# Patient Record
Sex: Male | Born: 1990
Health system: Southern US, Community
[De-identification: ages and names within clinical notes are randomized; demographics above are authoritative.]

## PROBLEM LIST (undated history)

## (undated) ENCOUNTER — Emergency Department (HOSPITAL_BASED_OUTPATIENT_CLINIC_OR_DEPARTMENT_OTHER): Admission: EM | Payer: Self-pay | Source: Home / Self Care

## (undated) DIAGNOSIS — F329 Major depressive disorder, single episode, unspecified: Secondary | ICD-10-CM

## (undated) DIAGNOSIS — F32A Depression, unspecified: Secondary | ICD-10-CM

## (undated) HISTORY — PX: OPEN ANTERIOR SHOULDER RECONSTRUCTION: SHX2100

---
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
## 2014-04-29 ENCOUNTER — Emergency Department (HOSPITAL_BASED_OUTPATIENT_CLINIC_OR_DEPARTMENT_OTHER)
Admission: EM | Admit: 2014-04-29 | Discharge: 2014-04-29 | Disposition: A | Discharge status: Home / Self Care | Payer: Self-pay | Attending: Emergency Medicine | Admitting: Emergency Medicine

## 2014-04-29 ENCOUNTER — Emergency Department (HOSPITAL_BASED_OUTPATIENT_CLINIC_OR_DEPARTMENT_OTHER): Payer: Self-pay

## 2014-04-29 ENCOUNTER — Encounter (HOSPITAL_BASED_OUTPATIENT_CLINIC_OR_DEPARTMENT_OTHER): Payer: Self-pay | Admitting: *Deleted

## 2014-04-29 DIAGNOSIS — Y9289 Other specified places as the place of occurrence of the external cause: Secondary | ICD-10-CM | POA: Insufficient documentation

## 2014-04-29 DIAGNOSIS — Y9301 Activity, walking, marching and hiking: Secondary | ICD-10-CM | POA: Insufficient documentation

## 2014-04-29 DIAGNOSIS — Y990 Civilian activity done for income or pay: Secondary | ICD-10-CM | POA: Insufficient documentation

## 2014-04-29 DIAGNOSIS — Z720 Tobacco use: Secondary | ICD-10-CM | POA: Insufficient documentation

## 2014-04-29 DIAGNOSIS — S93402A Sprain of unspecified ligament of left ankle, initial encounter: Secondary | ICD-10-CM | POA: Insufficient documentation

## 2014-04-29 DIAGNOSIS — W1842XA Slipping, tripping and stumbling without falling due to stepping into hole or opening, initial encounter: Secondary | ICD-10-CM | POA: Insufficient documentation

## 2014-04-29 NOTE — Discharge Instructions (Signed)
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
## 2014-04-29 NOTE — ED Provider Notes (Addendum)
CSN: [id number]     Arrival date & time 04/29/14  1513 History   First MD Initiated Contact with Patient 04/29/14 1515     Chief Complaint  Patient presents with  . Ankle Injury     (Consider location/radiation/quality/duration/timing/severity/associated sxs/prior Treatment) Patient is a 24 y.o. male presenting with lower extremity injury. The history is provided by the patient.  Ankle Injury This is a new (walking at work and stepped in a hole causing his left ankle to roll out) problem. The current episode started 3 to 5 hours ago. The problem occurs constantly. The problem has not changed since onset.Associated symptoms comments: Ankle pain.  No knee or leg pain. Normal sensation.. The symptoms are aggravated by walking. The symptoms are relieved by rest. He has tried rest for the symptoms. The treatment provided no relief.    History reviewed. No pertinent past medical history. History reviewed. No pertinent past surgical history. History reviewed. No pertinent family history. History  Substance Use Topics  . Smoking status: Current Every Day Smoker -- 1.00 packs/day    Types: Cigarettes  . Smokeless tobacco: Not on file  . Alcohol Use: No    Review of Systems  All other systems reviewed and are negative.     Allergies  Review of patient's allergies indicates no known allergies.  Home Medications   Prior to Admission medications   Not on File   BP 124/99 mmHg  Pulse 80  Temp(Src) 98.3 F (36.8 C) (Oral)  Resp 16  Ht 5\' 6"  (1.676 m)  Wt 145 lb (65.772 kg)  BMI 23.41 kg/m2  SpO2 100% Physical Exam  Constitutional: He is oriented to person, place, and time. He appears well-developed and well-nourished. No distress.  HENT:  Head: Normocephalic and atraumatic.  Eyes: EOM are normal. Pupils are equal, round, and reactive to light.  Cardiovascular: Normal rate.   Pulmonary/Chest: Effort normal.  Musculoskeletal:       Left ankle: He exhibits decreased range of  motion and swelling. Tenderness. Lateral malleolus tenderness found. No medial malleolus, no posterior TFL, no head of 5th metatarsal and no proximal fibula tenderness found.       Feet:  Tenderness over the anterior talofibular ligament with mild swelling and tenderness.  Pain with flexion and full extension of the foot.  Joint at this time appears stable.  Neurological: He is alert and oriented to person, place, and time.  Skin: Skin is warm and dry.  Psychiatric: He has a normal mood and affect. His behavior is normal.  Nursing note and vitals reviewed.   ED Course  Procedures (including critical care time) Labs Review Labs Reviewed - No data to display  Imaging Review Dg Ankle Complete Left  04/29/2014   CLINICAL DATA:  Pain following rolling type injury  EXAM: LEFT ANKLE COMPLETE - 3+ VIEW  COMPARISON:  None.  FINDINGS: Frontal, oblique, and lateral views obtained. There is no fracture or effusion. The ankle mortise appears intact. No appreciable joint space narrowing.  IMPRESSION: No fracture. Ankle mortise appears intact. No appreciable arthropathy.   Electronically Signed   By: Bretta BangWilliam  Woodruff III M.D.   On: 04/29/2014 15:35     EKG Interpretation None      MDM   Final diagnoses:  Ankle sprain, left, initial encounter    Patient with a twisting injury to his ankle today while at work and walking. Imaging is negative and no fibular head tenderness. Feel most likely that patient has a anterior talofibular  ligament sprain. Neurovascularly intact. Patient placed in Aircast and crutches.    Gwyneth Sprout, MD 04/29/14 1551  Gwyneth Sprout, MD 04/29/14 1553

## 2014-12-03 ENCOUNTER — Emergency Department (HOSPITAL_COMMUNITY): Payer: Medicaid Other

## 2014-12-03 ENCOUNTER — Encounter (HOSPITAL_COMMUNITY): Payer: Self-pay | Admitting: Neurology

## 2014-12-03 ENCOUNTER — Emergency Department (HOSPITAL_COMMUNITY): Payer: Self-pay

## 2014-12-03 ENCOUNTER — Emergency Department (HOSPITAL_COMMUNITY)
Admission: EM | Admit: 2014-12-03 | Discharge: 2014-12-03 | Disposition: A | Discharge status: Home / Self Care | Payer: Self-pay | Attending: Emergency Medicine | Admitting: Emergency Medicine

## 2014-12-03 DIAGNOSIS — R52 Pain, unspecified: Secondary | ICD-10-CM

## 2014-12-03 DIAGNOSIS — F17210 Nicotine dependence, cigarettes, uncomplicated: Secondary | ICD-10-CM | POA: Insufficient documentation

## 2014-12-03 DIAGNOSIS — R1030 Lower abdominal pain, unspecified: Secondary | ICD-10-CM

## 2014-12-03 DIAGNOSIS — N210 Calculus in bladder: Secondary | ICD-10-CM | POA: Insufficient documentation

## 2014-12-03 LAB — URINE MICROSCOPIC-ADD ON

## 2014-12-03 LAB — URINALYSIS, ROUTINE W REFLEX MICROSCOPIC
BILIRUBIN URINE: NEGATIVE
GLUCOSE, UA: NEGATIVE mg/dL
Ketones, ur: NEGATIVE mg/dL
Leukocytes, UA: NEGATIVE
Nitrite: NEGATIVE
PH: 8 (ref 5.0–8.0)
Protein, ur: 30 mg/dL — AB
SPECIFIC GRAVITY, URINE: 1.026 (ref 1.005–1.030)

## 2014-12-03 MED ORDER — OXYCODONE-ACETAMINOPHEN 5-325 MG PO TABS: 1.0000 | ORAL_TABLET | Freq: Four times a day (QID) | ORAL | Status: DC | PRN

## 2014-12-03 MED ORDER — OXYCODONE-ACETAMINOPHEN 5-325 MG PO TABS
1.0000 | ORAL_TABLET | Freq: Once | ORAL | Status: AC
  Administered 2014-12-03: 1 via ORAL
  Filled 2014-12-03: qty 1

## 2014-12-03 NOTE — ED Notes (Signed)
Pt stable, ambulatory, states understanding of discharge instructions 

## 2014-12-03 NOTE — Discharge Instructions (Signed)
1. Medications: Percocet for pain as needed - this medication can make you drowsy - please do not drink, drive, or operate heavy machinery while on this medication, continue usual home medications 2. Treatment: rest, drink plenty of fluids 3. Follow Up: Please follow up with the urologist listed above for discussion of your diagnoses and further evaluation after today's visit; Please return to the ER for any new or worsening symptoms, any additional concerns.

## 2014-12-03 NOTE — ED Provider Notes (Signed)
CSN: [id number]     Arrival date & time 12/03/14  1106 History   First MD Initiated Contact with Patient 12/03/14 1151     Chief Complaint  Patient presents with  . Groin Pain     (Consider location/radiation/quality/duration/timing/severity/associated sxs/prior Treatment) Patient is a 24 y.o. male presenting with groin pain. The history is provided by the patient and medical records. No language interpreter was used.  Groin Pain Pertinent negatives include no abdominal pain, arthralgias, congestion, coughing, headaches, myalgias, nausea, neck pain, rash, sore throat, vomiting or weakness.  Zachary Meyers is a 24 y.o. male  who presents to the Emergency Department complaining of acute onset of right sided testicular pain beginning this morning. Per patient, a similar episode has occurred in the past about a year ago, but he is unsure of the diagnosis and events surrounding episode. No dysuria, frequency, discharge. Admits to dribbling, hesitancy x 1 day. No other associated symptoms. 10/10 at onset. 4/10 now - pt. Has received pain medication upon arrival.    History reviewed. No pertinent past medical history. History reviewed. No pertinent past surgical history. No family history on file. Social History  Substance Use Topics  . Smoking status: Current Every Day Smoker -- 1.00 packs/day    Types: Cigarettes  . Smokeless tobacco: None  . Alcohol Use: No    Review of Systems  Constitutional: Negative.   HENT: Negative for congestion, rhinorrhea and sore throat.   Eyes: Negative for visual disturbance.  Respiratory: Negative for cough, shortness of breath and wheezing.   Cardiovascular: Negative.   Gastrointestinal: Negative for nausea, vomiting, abdominal pain, diarrhea and constipation.  Genitourinary: Positive for difficulty urinating and testicular pain. Negative for urgency, frequency and discharge.  Musculoskeletal: Negative for myalgias, back pain, arthralgias and neck pain.   Skin: Negative for rash.  Neurological: Negative for dizziness, weakness and headaches.      Allergies  Review of patient's allergies indicates no known allergies.  Home Medications   Prior to Admission medications   Medication Sig Start Date End Date Taking? Authorizing Provider  oxyCODONE-acetaminophen (PERCOCET/ROXICET) 5-325 MG tablet Take 1 tablet by mouth every 6 (six) hours as needed for severe pain. 12/03/14   Jaime Pilcher Ward, PA-C   BP 116/70 mmHg  Pulse 50  Temp(Src) 97.7 F (36.5 C) (Oral)  Resp 15  SpO2 98% Physical Exam  Constitutional: He is oriented to person, place, and time. He appears well-developed and well-nourished.  Alert and in no acute distress  HENT:  Head: Normocephalic and atraumatic.  Cardiovascular: Normal rate, regular rhythm, normal heart sounds and intact distal pulses.  Exam reveals no gallop and no friction rub.   No murmur heard. Pulmonary/Chest: Effort normal and breath sounds normal. No respiratory distress. He has no wheezes. He has no rales. He exhibits no tenderness.  Abdominal: He exhibits no mass. There is no rebound and no guarding.  Abdomen soft, non-distended Mild suprapubic tenderness Bowel sounds positive in all four quadrants  Genitourinary: Penis normal. Cremasteric reflex is present. Right testis shows tenderness. No penile erythema or penile tenderness. No discharge found.  Asymmetric, high-riding right testicle + scrotal swelling  Musculoskeletal: He exhibits no edema.  Neurological: He is alert and oriented to person, place, and time.  Skin: Skin is warm and dry. No rash noted.  Psychiatric: He has a normal mood and affect. His behavior is normal. Judgment and thought content normal.  Nursing note and vitals reviewed.   ED Course  Procedures (including critical  care time) Labs Review Labs Reviewed  URINALYSIS, ROUTINE W REFLEX MICROSCOPIC (NOT AT [facility]) - Abnormal; Notable for the following:    APPearance TURBID  (*)    Hgb urine dipstick LARGE (*)    Protein, ur 30 (*)    All other components within normal limits  URINE MICROSCOPIC-ADD ON - Abnormal; Notable for the following:    Squamous Epithelial / LPF 0-5 (*)    Bacteria, UA FEW (*)    All other components within normal limits  GC/CHLAMYDIA PROBE AMP ([place]) NOT AT [facility]    Imaging Review US Scrotum  12/03/2014  CLINICAL DATA:  Testicular pain, evaluate for testicular torsion. EXAM: SCROTAL ULTRASOUND DOPPLER ULTRASOUND OF THE TESTICLES TECHNIQUE: Complete ultrasound examination of the testicles, epididymis, and other scrotal structures was performed. Color and spectral Doppler ultrasound were also utilized to evaluate blood flow to the testicles. COMPARISON:  None in PACs FINDINGS: Right testicle Measurements: 4.1 x 2.2 x 2.8 cm. No mass or microlithiasis visualized. Left testicle Measurements: 4.3 x 1.9 x 2.7 cm. No mass or microlithiasis visualized. Right epididymis:  Normal in size and appearance. Left epididymis:  Normal in size and appearance. Hydrocele:  None visualized. Varicocele:  None visualized. Pulsed Doppler interrogation of both testes demonstrates normal low resistance arterial and venous waveforms bilaterally. IMPRESSION: 1. The testes exhibit normal echotexture and vascularity. No testicular masses are observed. 2. The epididymal structures are normal. There is no evidence of a epididymal cyst or acute epididymitis. 3. There is no hydrocele nor varicocele. Electronically Signed   By: Oakes  [place] M.D.   On: 12/03/2014 13:23   [place] Art/ven Flow Abd Pelv Doppler  12/03/2014  CLINICAL DATA:  Testicular pain, evaluate for testicular torsion. EXAM: SCROTAL ULTRASOUND DOPPLER ULTRASOUND OF THE TESTICLES TECHNIQUE: Complete ultrasound examination of the testicles, epididymis, and other scrotal structures was performed. Color and spectral Doppler ultrasound were also utilized to evaluate blood flow to the testicles. COMPARISON:  None in  PACs FINDINGS: Right testicle Measurements: 4.1 x 2.2 x 2.8 cm. No mass or microlithiasis visualized. Left testicle Measurements: 4.3 x 1.9 x 2.7 cm. No mass or microlithiasis visualized. Right epididymis:  Normal in size and appearance. Left epididymis:  Normal in size and appearance. Hydrocele:  None visualized. Varicocele:  None visualized. Pulsed Doppler interrogation of both testes demonstrates normal low resistance arterial and venous waveforms bilaterally. IMPRESSION: 1. The testes exhibit normal echotexture and vascularity. No testicular masses are observed. 2. The epididymal structures are normal. There is no evidence of a epididymal cyst or acute epididymitis. 3. There is no hydrocele nor varicocele. Electronically Signed   By: Joshu  [place] M.D.   On: 12/03/2014 13:23   Ct Renal Stone Study  12/03/2014  CLINICAL DATA:  Woke up this morning with right testicle pain, denies swelling. Reports hx of same unsure of cause. Able to urinate a small amount. Pt is uncomfortable sitting. Denies hematuria EXAM: CT ABDOMEN AND PELVIS WITHOUT CONTRAST TECHNIQUE: Multidetector CT imaging of the abdomen and pelvis was performed following the standard protocol without IV contrast. COMPARISON:  Current scrotal ultrasound. Previous abdomen and pelvis CT, 10/09/2009 FINDINGS: Lung bases:  Clear.  Heart normal size. Liver, spleen, gallbladder, pancreas, adrenal glands:  Normal. Kidneys, ureters, bladder: There is a 2 mm stone in the right posterior inferior bladder that may be within the bladder mucosal side of the right ureterovesicular junction. There is slight prominence of the right renal pelvis and mild dilation of the right ureter. There are  no other ureteral stones. There is no intrarenal stone. No renal masses. Bladder is otherwise unremarkable. Lymph nodes:  No adenopathy. Ascites:  None. Gastrointestinal:  Normal.  Normal appendix visualized. Musculoskeletal:  Normal. IMPRESSION: 1. 2 mm stone projects along the  right posterior inferior bladder. This likely resides in the bladder side of the right ureterovesicular junction. There is mild dilation of right ureter mild prominence of the right renal pelvis. No other evidence of obstruction. The stone potentially may have passed into the bladder. 2. No other abnormalities.  No intrarenal stones. Electronically Signed   By: Amie Portland M.D.   On: 12/03/2014 15:59   I have personally reviewed and evaluated these images and lab results as part of my medical decision-making.   EKG Interpretation None      MDM   Final diagnoses:  Groin pain  Bladder stone   Zachary Bast presents with acute onset of scrotal pain. Asymmetric testes, + cremasteric reflexes bilat. U/s shows normal testes, no torsion, no masses. No evidence for epididymitis or epididymal cyst, no hydrocele or varicocele.  U/a shows large amounts of blood in urine.  Discussed case with on call urology, who recommend CT to rule out distal ureteral stone, if negative f/up OP CT shows 2 mm stone of right utererovesicular junction which is likely cause of pain and swelling. Will discharge home with pain meds and follow up with urology. Discharge instructions given, pain controlled.    [facility] Ward, PA-C 12/03/14 1705  Pricilla Loveless, MD 12/04/14 [phone]

## 2014-12-03 NOTE — ED Notes (Signed)
Patient transported to Ultrasound 

## 2014-12-03 NOTE — ED Notes (Signed)
Woke up this morning with right testicle pain, denies swelling. Reports hx of same unsure of cause. Able to urinate a small amount. Pt is uncomfortable sitting. Denies hematuria.

## 2014-12-04 LAB — GC/CHLAMYDIA PROBE AMP (~~LOC~~) NOT AT ARMC
Chlamydia: NEGATIVE
Neisseria Gonorrhea: NEGATIVE

## 2015-09-13 ENCOUNTER — Ambulatory Visit (INDEPENDENT_AMBULATORY_CARE_PROVIDER_SITE_OTHER): Payer: No Typology Code available for payment source | Admitting: Family Medicine

## 2015-09-13 ENCOUNTER — Encounter: Payer: Self-pay | Admitting: Family Medicine

## 2015-09-13 VITALS — BP 117/70 | HR 65 | Temp 98.0°F | Resp 16 | Ht 66.0 in | Wt 131.0 lb

## 2015-09-13 DIAGNOSIS — L409 Psoriasis, unspecified: Secondary | ICD-10-CM

## 2015-09-13 DIAGNOSIS — K0889 Other specified disorders of teeth and supporting structures: Secondary | ICD-10-CM

## 2015-09-13 DIAGNOSIS — Z23 Encounter for immunization: Secondary | ICD-10-CM

## 2015-09-13 DIAGNOSIS — Z0000 Encounter for general adult medical examination without abnormal findings: Secondary | ICD-10-CM

## 2015-09-13 LAB — COMPLETE METABOLIC PANEL WITH GFR
ALBUMIN: 4.3 g/dL (ref 3.6–5.1)
ALK PHOS: 65 U/L (ref 40–115)
ALT: 33 U/L (ref 9–46)
AST: 27 U/L (ref 10–40)
BUN: 12 mg/dL (ref 7–25)
CHLORIDE: 106 mmol/L (ref 98–110)
CO2: 27 mmol/L (ref 20–31)
Calcium: 9.3 mg/dL (ref 8.6–10.3)
Creat: 0.85 mg/dL (ref 0.60–1.35)
GFR, Est African American: >89 mL/min (ref >=60)
GLUCOSE: 75 mg/dL (ref 65–99)
POTASSIUM: 4.4 mmol/L (ref 3.5–5.3)
SODIUM: 141 mmol/L (ref 135–146)
Total Bilirubin: 0.4 mg/dL (ref 0.2–1.2)
Total Protein: 6.2 g/dL (ref 6.1–8.1)

## 2015-09-13 LAB — CBC WITH DIFFERENTIAL/PLATELET
BASOS ABS: 0 {cells}/uL (ref 0–200)
Basophils Relative: 0 %
EOS ABS: 146 {cells}/uL (ref 15–500)
Eosinophils Relative: 2 %
HEMATOCRIT: 42.3 % (ref 38.5–50.0)
HEMOGLOBIN: 14.5 g/dL (ref 13.2–17.1)
LYMPHS ABS: 1971 {cells}/uL (ref 850–3900)
Lymphocytes Relative: 27 %
MCH: 32.2 pg (ref 27.0–33.0)
MCHC: 34.3 g/dL (ref 32.0–36.0)
MCV: 94 fL (ref 80.0–100.0)
MONO ABS: 657 {cells}/uL (ref 200–950)
MPV: 10.2 fL (ref 7.5–12.5)
Monocytes Relative: 9 %
NEUTROS ABS: 4526 {cells}/uL (ref 1500–7800)
NEUTROS PCT: 62 %
Platelets: 235 10*3/uL (ref 140–400)
RBC: 4.5 MIL/uL (ref 4.20–5.80)
RDW: 12.8 % (ref 11.0–15.0)
WBC: 7.3 10*3/uL (ref 3.8–10.8)

## 2015-09-13 LAB — HIV ANTIBODY (ROUTINE TESTING W REFLEX): HIV 1&2 Ab, 4th Generation: NONREACTIVE

## 2015-09-14 LAB — HEMOGLOBIN A1C
HEMOGLOBIN A1C: 4.7 % (ref <5.7)
Mean Plasma Glucose: 88 mg/dL

## 2015-09-24 NOTE — Progress Notes (Signed)
Zachary Meyers Iddings, is a 25 y.o. male  YNW:[id number]:[id number]  MVH:[id number]:[id number]  DOB - March 13, 1990  CC:  Chief Complaint  Patient presents with  . Establish Care    psoriasis  . Dental Pain    broken tooth        HPI: Zachary Meyers Hopson is a 25 y.o. male here to establish care. He reports being generally healthy except for psorasis. He also request dental referral for broken tooth. He does report being treated with sertraline for depression. He is on no other chronic medictions. He reports chronic back pain and minor headaches.   Health Maintenance: His Tdap is up to date and he will receive influenza vaccine today. He reports smoking about 10 cigarettes a day and not being ready to quit. He drinks occ alcohol.   No Known Allergies History reviewed. No pertinent past medical history. Current Outpatient Prescriptions on File Prior to Visit  Medication Sig Dispense Refill  . oxyCODONE-acetaminophen (PERCOCET/ROXICET) 5-325 MG tablet Take 1 tablet by mouth every 6 (six) hours as needed for severe pain. (Patient not taking: Reported on 09/13/2015) 8 tablet 0   No current facility-administered medications on file prior to visit.    History reviewed. No pertinent family history. Social History   Social History  . Marital status: Single    Spouse name: N/A  . Number of children: N/A  . Years of education: N/A   Occupational History  . Not on file.   Social History Main Topics  . Smoking status: Current Every Day Smoker    Packs/day: 0.50    Types: Cigarettes  . Smokeless tobacco: Former NeurosurgeonUser  . Alcohol use Yes     Comment: occ  . Drug use: No  . Sexual activity: Yes    Birth control/ protection: None   Other Topics Concern  . Not on file   Social History Narrative  . No narrative on file    Review of Systems: Constitutional: Negative Skin: Positive for psorasis HENT: Negative  Eyes: Negative  Neck: Negative Respiratory: Negative Cardiovascular: Negative Gastrointestinal:  Negative Genitourinary: Negative  Musculoskeletal: Positive for low back pain   Neurological: positive for occ headaches Hematological: Negative  Psychiatric/Behavioral: positive for depression   Objective:   Vitals:   09/13/15 1411  BP: 117/70  Pulse: 65  Resp: 16  Temp: 98 F (36.7 C)    Physical Exam: Constitutional: Patient appears well-developed and well-nourished. No distress. HENT: Normocephalic, atraumatic, External right and left ear normal. Oropharynx is clear and moist. Teeth in poor repair Eyes: Conjunctivae and EOM are normal. PERRLA, no scleral icterus. Neck: Normal ROM. Neck supple. No lymphadenopathy, No thyromegaly. CVS: RRR, S1/S2 +, no murmurs, no gallops, no rubs Pulmonary: Effort and breath sounds normal, no stridor, rhonchi, wheezes, rales.  Abdominal: Soft. Normoactive BS,, no distension, tenderness, rebound or guarding.  Musculoskeletal: Normal range of motion. No edema and no tenderness.  Neuro: Alert.Normal muscle tone coordination. Non-focal Skin: Positive for wide-spread plaque psorasis on anterior and posterior trunk. Psychiatric: Normal mood and affect. Behavior, judgment, thought content normal.  Lab Results  Component Value Date   WBC 7.3 09/13/2015   HGB 14.5 09/13/2015   HCT 42.3 09/13/2015   MCV 94.0 09/13/2015   PLT 235 09/13/2015   Lab Results  Component Value Date   CREATININE 0.85 09/13/2015   BUN 12 09/13/2015   NA 141 09/13/2015   K 4.4 09/13/2015   CL 106 09/13/2015   CO2 27 09/13/2015    Lab Results  Component Value Date   HGBA1C 4.7 09/13/2015   Lipid Panel  No results found for: CHOL, TRIG, HDL, CHOLHDL, VLDL, LDLCALC     Assessment and plan:   1. Psoriasis -Ambulatory referral to dermatology  2. Pain, dental  - Ambulatory referral to Dentistry  3. Healthcare maintenance * - COMPLETE METABOLIC PANEL WITH GFR - CBC with Differential - Hemoglobin A1c - HIV antibody (with reflex) - Flu Vaccine QUAD 36+  mos PF IM (Fluarix & Fluzone Quad PF)   Return in about 3 months (around 12/13/2015).  The patient was given clear instructions to go to ER or return to medical center if symptoms don't improve, worsen or new problems develop. The patient verbalized understanding.    Henrietta Hoover FNP  09/24/2015, 2:51 PM

## 2015-11-16 ENCOUNTER — Encounter (HOSPITAL_BASED_OUTPATIENT_CLINIC_OR_DEPARTMENT_OTHER): Payer: Self-pay

## 2015-11-16 ENCOUNTER — Emergency Department (HOSPITAL_BASED_OUTPATIENT_CLINIC_OR_DEPARTMENT_OTHER)
Admission: EM | Admit: 2015-11-16 | Discharge: 2015-11-16 | Disposition: A | Discharge status: Home / Self Care | Payer: Self-pay | Attending: Emergency Medicine | Admitting: Emergency Medicine

## 2015-11-16 ENCOUNTER — Emergency Department (HOSPITAL_BASED_OUTPATIENT_CLINIC_OR_DEPARTMENT_OTHER): Payer: Self-pay

## 2015-11-16 DIAGNOSIS — R079 Chest pain, unspecified: Secondary | ICD-10-CM

## 2015-11-16 DIAGNOSIS — F17210 Nicotine dependence, cigarettes, uncomplicated: Secondary | ICD-10-CM | POA: Insufficient documentation

## 2015-11-16 DIAGNOSIS — R0789 Other chest pain: Secondary | ICD-10-CM | POA: Insufficient documentation

## 2015-11-16 HISTORY — DX: Major depressive disorder, single episode, unspecified: F32.9

## 2015-11-16 HISTORY — DX: Depression, unspecified: F32.A

## 2015-11-16 LAB — CBC WITH DIFFERENTIAL/PLATELET
Basophils Absolute: 0 10*3/uL (ref 0.0–0.1)
Basophils Relative: 0 %
EOS PCT: 1 %
Eosinophils Absolute: 0.1 10*3/uL (ref 0.0–0.7)
HEMATOCRIT: 42.8 % (ref 39.0–52.0)
Hemoglobin: 14.4 g/dL (ref 13.0–17.0)
LYMPHS PCT: 22 %
Lymphs Abs: 2.1 10*3/uL (ref 0.7–4.0)
MCH: 32.1 pg (ref 26.0–34.0)
MCHC: 33.6 g/dL (ref 30.0–36.0)
MCV: 95.3 fL (ref 78.0–100.0)
MONO ABS: 0.7 10*3/uL (ref 0.1–1.0)
MONOS PCT: 7 %
NEUTROS ABS: 6.6 10*3/uL (ref 1.7–7.7)
Neutrophils Relative %: 70 %
PLATELETS: 217 10*3/uL (ref 150–400)
RBC: 4.49 MIL/uL (ref 4.22–5.81)
RDW: 12.3 % (ref 11.5–15.5)
WBC: 9.5 10*3/uL (ref 4.0–10.5)

## 2015-11-16 LAB — BASIC METABOLIC PANEL
ANION GAP: 6 (ref 5–15)
BUN: 15 mg/dL (ref 6–20)
CO2: 26 mmol/L (ref 22–32)
Calcium: 9.1 mg/dL (ref 8.9–10.3)
Chloride: 105 mmol/L (ref 101–111)
Creatinine, Ser: 0.84 mg/dL (ref 0.61–1.24)
GFR calc Af Amer: >60 mL/min (ref >60)
GLUCOSE: 97 mg/dL (ref 65–99)
POTASSIUM: 4 mmol/L (ref 3.5–5.1)
Sodium: 137 mmol/L (ref 135–145)

## 2015-11-16 LAB — TROPONIN I: Troponin I: <0.03 ng/mL (ref <0.03)

## 2015-11-16 MED ORDER — KETOROLAC TROMETHAMINE 30 MG/ML IJ SOLN
30.0000 mg | Freq: Once | INTRAMUSCULAR | Status: AC
  Administered 2015-11-16: 30 mg via INTRAVENOUS
  Filled 2015-11-16: qty 1

## 2015-11-16 MED ORDER — ALBUTEROL SULFATE HFA 108 (90 BASE) MCG/ACT IN AERS
2.0000 | INHALATION_SPRAY | Freq: Four times a day (QID) | RESPIRATORY_TRACT | Status: DC | PRN
  Administered 2015-11-16: 2 via RESPIRATORY_TRACT
  Filled 2015-11-16: qty 6.7

## 2015-11-16 NOTE — ED Triage Notes (Signed)
CP x today-started while at work putting dishes away-NAD-steady gait

## 2015-11-16 NOTE — Discharge Instructions (Signed)
Ibuprofen 600 mg every 6 hours as needed for pain. ° °Return to the emergency department if your symptoms significantly worsen or change. °

## 2015-11-16 NOTE — ED Provider Notes (Signed)
[facility] Provider Note   CSN: [id number] Arrival date & time: 11/16/15  1256     History   Chief Complaint Chief Complaint  Patient presents with  . Chest Pain    HPI Zachary Meyers is a 25 y.o. male.  Patient is a 25 year old male with history of depression and psoriasis. He presents for evaluation of chest pain. This began while he was at work. He was washing dishes and developed the sudden onset of severe, sharp pain to the center of his chest that "took his breath away". This lasted for several minutes, then significantly improved. He only feels a minor discomfort now. He denies any cardiac history. He denies any recent cough, fevers, or chills.      Past Medical History:  Diagnosis Date  . Depression     Patient Active Problem List   Diagnosis Date Noted  . Psoriasis 09/13/2015  . Pain, dental 09/13/2015    Past Surgical History:  Procedure Laterality Date  . OPEN ANTERIOR SHOULDER RECONSTRUCTION     right        Home Medications    Prior to Admission medications   Medication Sig Start Date End Date Taking? Authorizing Provider  sertraline (ZOLOFT) 25 MG tablet Take 25 mg by mouth daily.    Historical Provider, MD    Family History No family history on file.  Social History Social History  Substance Use Topics  . Smoking status: Current Every Day Smoker    Packs/day: 0.50    Types: Cigarettes  . Smokeless tobacco: Former NeurosurgeonUser  . Alcohol use Yes     Comment: occ     Allergies   Patient has no known allergies.   Review of Systems Review of Systems  All other systems reviewed and are negative.    Physical Exam Updated Vital Signs BP 138/90 (BP Location: Left Arm)   Pulse 78   Temp 98.1 F (36.7 C) (Oral)   Resp 16   Ht 5\' 6"  (1.676 m)   Wt 130 lb (59 kg)   SpO2 100%   BMI 20.98 kg/m   Physical Exam  Constitutional: He is oriented to person, place, and time. He appears well-developed and well-nourished. No  distress.  HENT:  Head: Normocephalic and atraumatic.  Mouth/Throat: Oropharynx is clear and moist.  Neck: Normal range of motion. Neck supple.  Cardiovascular: Normal rate and regular rhythm.  Exam reveals no friction rub.   No murmur heard. Pulmonary/Chest: Effort normal and breath sounds normal. No respiratory distress. He has no wheezes. He has no rales. He exhibits tenderness.  There is tenderness to palpation in the anterior chest wall. This reproduces his symptoms.  Abdominal: Soft. Bowel sounds are normal. He exhibits no distension. There is no tenderness.  Musculoskeletal: Normal range of motion. He exhibits no edema.  Neurological: He is alert and oriented to person, place, and time. Coordination normal.  Skin: Skin is warm and dry. He is not diaphoretic.  Nursing note and vitals reviewed.    ED Treatments / Results  Labs (all labs ordered are listed, but only abnormal results are displayed) Labs Reviewed  BASIC METABOLIC PANEL  CBC WITH DIFFERENTIAL/PLATELET  TROPONIN I    EKG  EKG Interpretation  Date/Time:  Tuesday November 16 2015 13:01:01 EST Ventricular Rate:  80 PR Interval:  122 QRS Duration: 84 QT Interval:  342 QTC Calculation: 394 R Axis:   77 Text Interpretation:  Normal sinus rhythm with sinus arrhythmia Possible Anterior infarct ,  age undetermined Abnormal ECG Confirmed by [facility]  MD, Bradlee Heitman ([id number]) on 11/16/2015 1:17:28 PM       Radiology No results found.  Procedures Procedures (including critical care time)  Medications Ordered in ED Medications  ketorolac (TORADOL) 30 MG/ML injection 30 mg (not administered)     Initial Impression / Assessment and Plan / ED Course  I have reviewed the triage vital signs and the nursing notes.  Pertinent labs & imaging results that were available during my care of the patient were reviewed by me and considered in my medical decision making (see chart for details).  Clinical Course     Patient  presents with sudden onset of severe, sharp chest pain that occurred while changing position. I suspect a musculoskeletal etiology. His workup reveals no abnormality in his EKG, troponin, and chest x-ray. He will be discharged with a diagnosis of nonspecific chest pain. I highly suspect a musculoskeletal etiology. He is to return as needed for any problems.  Final Clinical Impressions(s) / ED Diagnoses   Final diagnoses:  None    New Prescriptions New Prescriptions   No medications on file     Geoffery Lyonsouglas Fate Caster, MD 11/16/15 1423

## 2015-11-16 NOTE — ED Notes (Signed)
ED Provider at bedside. 

## 2016-02-04 ENCOUNTER — Emergency Department (HOSPITAL_COMMUNITY)
Admission: EM | Admit: 2016-02-04 | Discharge: 2016-02-04 | Disposition: A | Discharge status: Home / Self Care | Payer: No Typology Code available for payment source | Attending: Emergency Medicine | Admitting: Emergency Medicine

## 2016-02-04 ENCOUNTER — Encounter (HOSPITAL_COMMUNITY): Payer: Self-pay | Admitting: *Deleted

## 2016-02-04 DIAGNOSIS — J029 Acute pharyngitis, unspecified: Secondary | ICD-10-CM | POA: Insufficient documentation

## 2016-02-04 DIAGNOSIS — F17210 Nicotine dependence, cigarettes, uncomplicated: Secondary | ICD-10-CM | POA: Insufficient documentation

## 2016-02-04 LAB — CBC WITH DIFFERENTIAL/PLATELET
BASOS PCT: 0 %
Basophils Absolute: 0 10*3/uL (ref 0.0–0.1)
EOS ABS: 0.1 10*3/uL (ref 0.0–0.7)
Eosinophils Relative: 2 %
HEMATOCRIT: 44.8 % (ref 39.0–52.0)
Hemoglobin: 15 g/dL (ref 13.0–17.0)
Lymphocytes Relative: 27 %
Lymphs Abs: 1.7 10*3/uL (ref 0.7–4.0)
MCH: 32 pg (ref 26.0–34.0)
MCHC: 33.5 g/dL (ref 30.0–36.0)
MCV: 95.5 fL (ref 78.0–100.0)
MONO ABS: 0.5 10*3/uL (ref 0.1–1.0)
MONOS PCT: 8 %
NEUTROS ABS: 3.9 10*3/uL (ref 1.7–7.7)
Neutrophils Relative %: 63 %
Platelets: 226 10*3/uL (ref 150–400)
RBC: 4.69 MIL/uL (ref 4.22–5.81)
RDW: 12.8 % (ref 11.5–15.5)
WBC: 6.2 10*3/uL (ref 4.0–10.5)

## 2016-02-04 LAB — COMPREHENSIVE METABOLIC PANEL
ALBUMIN: 4 g/dL (ref 3.5–5.0)
ALT: 26 U/L (ref 17–63)
ANION GAP: 8 (ref 5–15)
AST: 28 U/L (ref 15–41)
Alkaline Phosphatase: 56 U/L (ref 38–126)
BILIRUBIN TOTAL: 0.8 mg/dL (ref 0.3–1.2)
BUN: 14 mg/dL (ref 6–20)
CO2: 24 mmol/L (ref 22–32)
Calcium: 9.2 mg/dL (ref 8.9–10.3)
Chloride: 108 mmol/L (ref 101–111)
Creatinine, Ser: 1.19 mg/dL (ref 0.61–1.24)
GFR calc Af Amer: >60 mL/min (ref >60)
GLUCOSE: 121 mg/dL — AB (ref 65–99)
POTASSIUM: 4.2 mmol/L (ref 3.5–5.1)
Sodium: 140 mmol/L (ref 135–145)
TOTAL PROTEIN: 6.4 g/dL — AB (ref 6.5–8.1)

## 2016-02-04 LAB — RAPID STREP SCREEN (MED CTR MEBANE ONLY): Streptococcus, Group A Screen (Direct): NEGATIVE

## 2016-02-04 MED ORDER — PENICILLIN G BENZATHINE 1200000 UNIT/2ML IM SUSP
1.2000 10*6.[IU] | Freq: Once | INTRAMUSCULAR | Status: AC
  Administered 2016-02-04: 1.2 10*6.[IU] via INTRAMUSCULAR
  Filled 2016-02-04: qty 2

## 2016-02-04 NOTE — ED Provider Notes (Signed)
[facility]-EMERGENCY DEPT Provider Note   CSN: [id number] Arrival date & time: 02/04/16  1146  By signing my name below, I, Majel Homer, attest that this documentation has been prepared under the direction and in the presence of [facility], PA-C . Electronically Signed: Majel Homer, Scribe. 02/04/2016. 2:45 PM.  History   Chief Complaint Chief Complaint  Patient presents with  . Sore Throat   The history is provided by the patient. No language interpreter was used.   HPI Comments: Zachary Meyers is a 26 y.o. male who presents to the Emergency Department complaining of gradually worsening, sore throat that began this morning. Pt reports associated fatigue, generalized body aches, nasal congestion, cough, and rhinorrhea. He notes his sister whom he frequently spends time with was recently diagnosed with strep throat ~3 days ago. Pt denies shortness of breath, fever, abdominal pain, nausea, vomiting, and diarrhea.   Past Medical History:  Diagnosis Date  . Depression    Patient Active Problem List   Diagnosis Date Noted  . Psoriasis 09/13/2015  . Pain, dental 09/13/2015   Past Surgical History:  Procedure Laterality Date  . OPEN ANTERIOR SHOULDER RECONSTRUCTION     right     Home Medications    Prior to Admission medications   Medication Sig Start Date End Date Taking? Authorizing Provider  sertraline (ZOLOFT) 25 MG tablet Take 25 mg by mouth daily.    Historical Provider, MD   Family History No family history on file.  Social History Social History  Substance Use Topics  . Smoking status: Current Every Day Smoker    Packs/day: 0.50    Types: Cigarettes  . Smokeless tobacco: Former Neurosurgeon  . Alcohol use Yes     Comment: occ   Allergies   Patient has no known allergies.  Review of Systems Review of Systems  Constitutional: Positive for fatigue. Negative for fever.  HENT: Positive for congestion, ear pain, rhinorrhea and sore throat.   Respiratory: Positive for cough.  Negative for shortness of breath.   Gastrointestinal: Negative for abdominal pain, diarrhea, nausea and vomiting.  Musculoskeletal: Positive for myalgias.  All other systems reviewed and are negative.  Physical Exam Updated Vital Signs BP 120/76 (BP Location: Right Arm)   Pulse 116   Temp 98.1 F (36.7 C) (Oral)   Resp 18   Ht 5\' 6"  (1.676 m)   Wt 134 lb (60.8 kg)   SpO2 98%   BMI 21.63 kg/m   Physical Exam  Constitutional: He is oriented to person, place, and time. He appears well-developed and well-nourished. No distress.  HENT:  Head: Normocephalic and atraumatic.  Mouth/Throat: Posterior oropharyngeal erythema present.  OP with erythema and tonsillar hypertrophy. No exudate. + nasal congestion with mucosal edema.   Neck: Normal range of motion. Neck supple.  No meningeal signs.   Cardiovascular: Normal rate, regular rhythm and normal heart sounds.   Pulmonary/Chest: Effort normal.  Lungs are clear to auscultation bilaterally - no w/r/r  Abdominal: Soft. He exhibits no distension. There is no tenderness.  Musculoskeletal: Normal range of motion.  Lymphadenopathy:    He has cervical adenopathy.  Neurological: He is alert and oriented to person, place, and time.  Skin: Skin is warm and dry. He is not diaphoretic.  Nursing note and vitals reviewed.  ED Treatments / Results  DIAGNOSTIC STUDIES:  Oxygen Saturation is 98% on RA, normal by my interpretation.    COORDINATION OF CARE:  2:42 PM Discussed treatment plan with pt at bedside  and pt agreed to plan.  Labs (all labs ordered are listed, but only abnormal results are displayed) Labs Reviewed  COMPREHENSIVE METABOLIC PANEL - Abnormal; Notable for the following:       Result Value   Glucose, Bld 121 (*)    Total Protein 6.4 (*)    All other components within normal limits  RAPID STRE[facility] SCREEN (NOT [facility])  CULTURE, GROUP A STREP ([facility])  CBC WITH DIFFERENTIAL/PLATELET    EKG  EKG Interpretation None        Radiology No results found.  Procedures Procedures (including critical care time)  Medications Ordered in ED Medications - No data to display  Initial Impression / Assessment and Plan / ED Course  I have reviewed the triage vital signs and the nursing notes.  Pertinent labs & imaging results that were available during my care of the patient were reviewed by me and considered in my medical decision making (see chart for details).     Zachary Meyers is a 26 y.o. male who presents to ED for sore throat which began this morning. His sister was recently diagnosed with strep and he is concerned that he may have gotten it as well. Lab work was obtained in triage prior to my examination. Reviewed and reassuring. On exam, patient is afebrile. Oropharynx with erythema and tonsillar hypertrophy, however no exudates appreciated. Rapid strep negative. Discussed risks of antibiotic therapy. Discussed option of prophylactic treatment given strep exposure versus awaiting culture results and antibiotics only if culture positive. Patient would prefer treatment. Bicillin IM given in ED. Home care instructions discussed and all questions answered.    I personally performed the services described in this documentation, which was scribed in my presence. The recorded information has been reviewed and is accurate.   Final Clinical Impressions(s) / ED Diagnoses   Final diagnoses:  None    New Prescriptions New Prescriptions   No medications on file         [facility] Pilcher Ward, PA-C 02/04/16 1532    Mancel BaleElliott Wentz, MD 02/07/16 1056

## 2016-02-04 NOTE — Discharge Instructions (Signed)
Salt water gargles or warm tea with honey help with sore throat. Ibuprofen can be used for additional pain as well.  Wash hands often. Rest.  Return to ER for high fevers, difficulty breathing, new or worsening symptoms, any additional concerns.

## 2016-02-04 NOTE — ED Triage Notes (Signed)
Pt c/o sore throat, reports his sister has strep throat, pt c/o fatigue & generalized body aches, pt c/o R ear pain, denies cough, A&O x4

## 2016-02-05 LAB — CULTURE, GROUP A STREP (THRC)

## 2016-03-13 ENCOUNTER — Ambulatory Visit: Payer: No Typology Code available for payment source | Admitting: Family Medicine

## 2016-08-27 ENCOUNTER — Emergency Department (HOSPITAL_COMMUNITY)
Admission: EM | Admit: 2016-08-27 | Discharge: 2016-08-27 | Disposition: A | Discharge status: Home / Self Care | Payer: No Typology Code available for payment source | Attending: Emergency Medicine | Admitting: Emergency Medicine

## 2016-08-27 ENCOUNTER — Encounter (HOSPITAL_COMMUNITY): Payer: Self-pay

## 2016-08-27 DIAGNOSIS — J36 Peritonsillar abscess: Secondary | ICD-10-CM | POA: Insufficient documentation

## 2016-08-27 DIAGNOSIS — F17210 Nicotine dependence, cigarettes, uncomplicated: Secondary | ICD-10-CM | POA: Insufficient documentation

## 2016-08-27 DIAGNOSIS — Z79899 Other long term (current) drug therapy: Secondary | ICD-10-CM | POA: Insufficient documentation

## 2016-08-27 LAB — RAPID STREP SCREEN (MED CTR MEBANE ONLY): STREPTOCOCCUS, GROUP A SCREEN (DIRECT): NEGATIVE

## 2016-08-27 MED ORDER — CLINDAMYCIN HCL 300 MG PO CAPS: 300.0000 mg | ORAL_CAPSULE | Freq: Four times a day (QID) | ORAL | 0 refills | Status: DC

## 2016-08-27 MED ORDER — IBUPROFEN 600 MG PO TABS: 600.0000 mg | ORAL_TABLET | Freq: Four times a day (QID) | ORAL | 0 refills | Status: DC | PRN

## 2016-08-27 NOTE — ED Provider Notes (Signed)
[facility] Provider Note   CSN: [id number] Arrival date & time: 08/27/16  1252     History   Chief Complaint Chief Complaint  Patient presents with  . Sore Throat    HPI Zachary Meyers is a 26 y.o. male.  HPI Patient reports he's had a swollen sore spot on the right side of his neck for about a week. He reports he thought it might go away but it hasn't. She states it feels like when he swallows, things get tight and narrow. He reports it's painful on the side of his throat. He has not developed fever or difficulty breathing. No associated symptoms. Patient ports he did have strep throat about a year ago. He reports this feels different from his episode of strep. He otherwise has not been sick. Past Medical History:  Diagnosis Date  . Depression     Patient Active Problem List   Diagnosis Date Noted  . Psoriasis 09/13/2015  . Pain, dental 09/13/2015    Past Surgical History:  Procedure Laterality Date  . OPEN ANTERIOR SHOULDER RECONSTRUCTION     right        Home Medications    Prior to Admission medications   Medication Sig Start Date End Date Taking? Authorizing Provider  clindamycin (CLEOCIN) 300 MG capsule Take 1 capsule (300 mg total) by mouth 4 (four) times daily. X 7 days 08/27/16   Arby Barrette, MD  ibuprofen (ADVIL,MOTRIN) 600 MG tablet Take 1 tablet (600 mg total) by mouth every 6 (six) hours as needed. 08/27/16   Arby Barrette, MD  sertraline (ZOLOFT) 25 MG tablet Take 25 mg by mouth daily.    [provider]    Family History History reviewed. No pertinent family history.  Social History Social History  Substance Use Topics  . Smoking status: Current Every Day Smoker    Packs/day: 0.25    Types: Cigarettes  . Smokeless tobacco: Former Neurosurgeon  . Alcohol use Yes     Comment: occ     Allergies   Patient has no known allergies.   Review of Systems Review of Systems  10 Systems reviewed and are negative for acute change  except as noted in the HPI.  Physical Exam Updated Vital Signs BP 118/77 (BP Location: Right Arm)   Pulse 97   Temp 98.2 F (36.8 C) (Oral)   Resp 16   Ht 5\' 6"  (1.676 m)   Wt 56.2 kg (124 lb)   SpO2 97%   BMI 20.01 kg/m   Physical Exam  Constitutional: He is oriented to person, place, and time. He appears well-developed and well-nourished. No distress.  HENT:  Head: Normocephalic and atraumatic.  Nose normal. No facial swelling or asymmetry. Intraoral exam shows moderate swelling of the right tonsil relative to the left. Slight trace exudate on the right tonsil. Patient does not have significant fullness anterior to the tonsillar pillar. The posterior oropharynx remains widely patent. The patient does have significant dental decay at the lower molars on the right side. He does not have facial swelling from external visualization. He does have a tender and mildly enlarged tonsillar lymph node on the right. This is mobile. No trismus.  Eyes: Pupils are equal, round, and reactive to light. EOM are normal.  Cardiovascular: Normal rate, regular rhythm and normal heart sounds.   Pulmonary/Chest: Effort normal and breath sounds normal.  Musculoskeletal: Normal range of motion.  Neurological: He is alert and oriented to person, place, and time. No cranial  nerve deficit. He exhibits normal muscle tone. Coordination normal.  Skin: Skin is warm and dry.  Patient has plaques of eczema including on his throat and face. None Show signs of secondary infection.  Psychiatric: He has a normal mood and affect.             ED Treatments / Results  Labs (all labs ordered are listed, but only abnormal results are displayed) Labs Reviewed  RAPID STREP SCREEN (NOT AT [facility])  CULTURE, GROUP A STREP [facility])    EKG  EKG Interpretation None       Radiology No results found.  Procedures Procedures (including critical care time)  Medications Ordered in ED Medications - No data to  display   Initial Impression / Assessment and Plan / ED Course  I have reviewed the triage vital signs and the nursing notes.  Pertinent labs & imaging results that were available during my care of the patient were reviewed by me and considered in my medical decision making (see chart for details).     Final Clinical Impressions(s) / ED Diagnoses   Final diagnoses:  Peritonsillar abscess  Based on physical examination I suspect early peritonsillar abscess. There is nothing drainable at this time. Although it is somewhat difficult to appreciate in the pictures in the physical exam, the patient does have moderately large swelling of the right tonsil relative to the left. Patient also has significant dental decay of the inferior molars. At this time, I will opt to initiate clindamycin. Patient is aware of close observation for any rapidly increasing swelling, fever or neck stiffness. Patient is counseled to follow-up with ENT.  New Prescriptions New Prescriptions   CLINDAMYCIN (CLEOCIN) 300 MG CAPSULE    Take 1 capsule (300 mg total) by mouth 4 (four) times daily. X 7 days   IBUPROFEN (ADVIL,MOTRIN) 600 MG TABLET    Take 1 tablet (600 mg total) by mouth every 6 (six) hours as needed.     Arby Barrette, MD 08/27/16 1409

## 2016-08-27 NOTE — ED Triage Notes (Signed)
Onset 1 1/2 weeks swollen on right side of neck, unable to chew on right side and paniful when swallowing.  No fevers, cough/cold symptoms.

## 2016-08-29 LAB — CULTURE, GROUP A STREP (THRC)

## 2016-09-01 MED FILL — CLINDAMYCIN HCL 300 MG CAP: 300 | 7 days supply | Qty: 28 | Fill #0

## 2017-04-20 IMAGING — US US ART/VEN ABD/PELV/SCROTUM DOPPLER LTD
1 series · 14 of 25 positions shown · non-contrast
Comparison: None in PACs

CLINICAL DATA: Testicular pain, evaluate for testicular torsion.

EXAM:
SCROTAL ULTRASOUND
DOPPLER ULTRASOUND OF THE TESTICLES
TECHNIQUE: Complete ultrasound examination of the testicles, epididymis, and
other scrotal structures was performed. Color and spectral Doppler
ultrasound were also utilized to evaluate blood flow to the
testicles.

[Series 1: us art/ven abd/pelv/scrotum doppler ltd · 0.07mm/px · 14 of 58 slices shown]
[im 1/58]
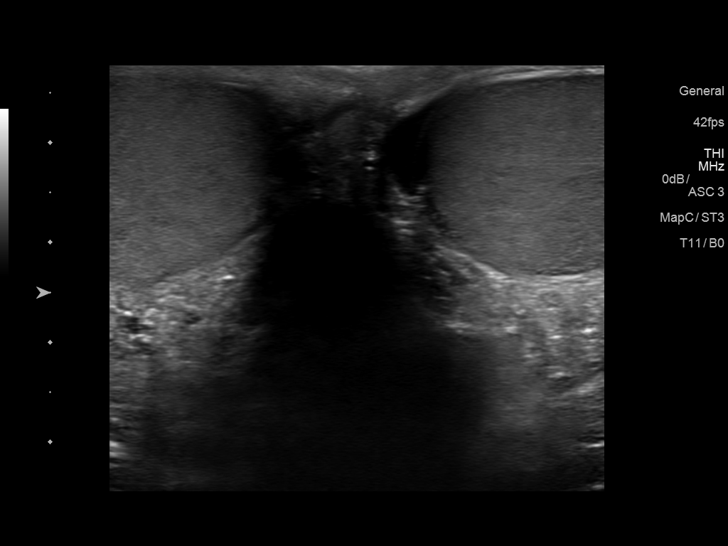
[im 5/58]
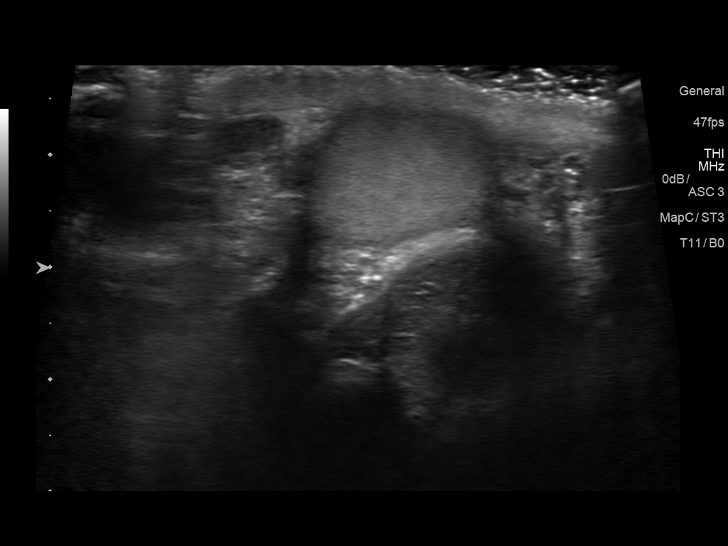
[im 10/58]
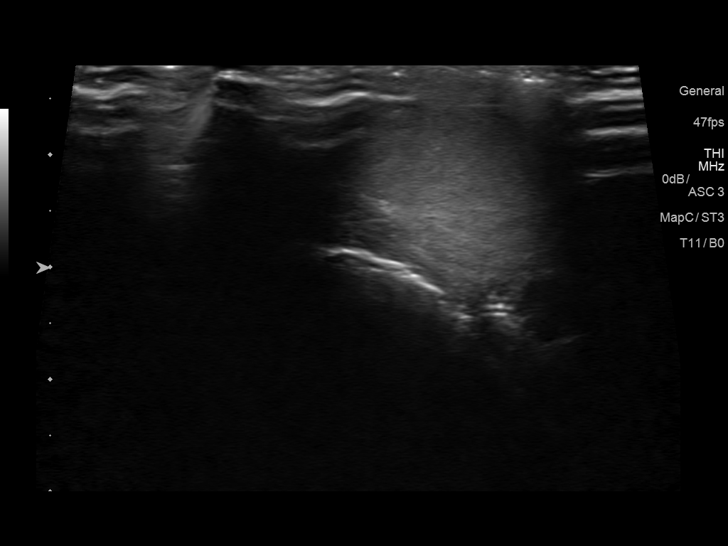
[im 15/58]
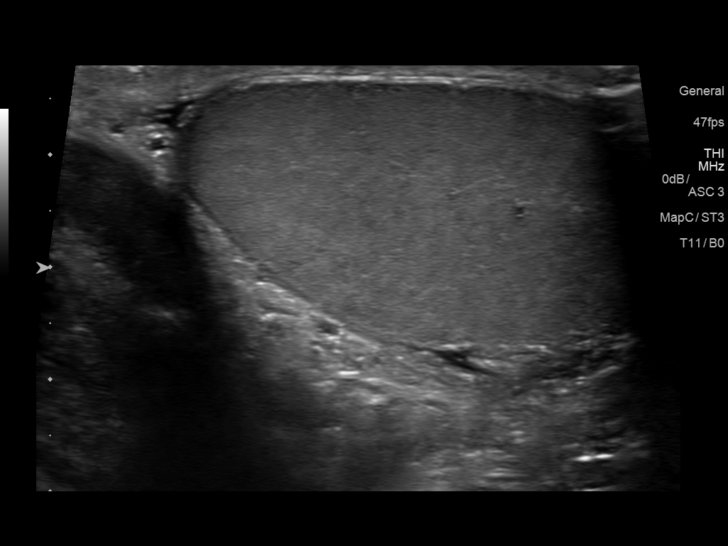
[im 20/58]
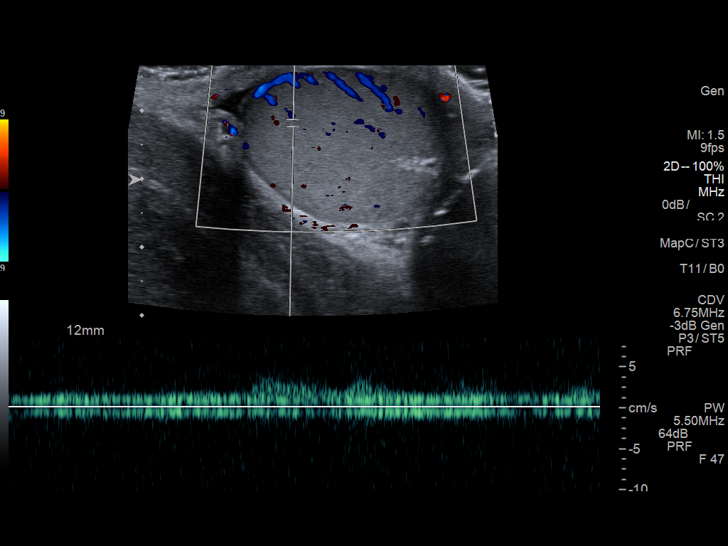
[im 22/58]
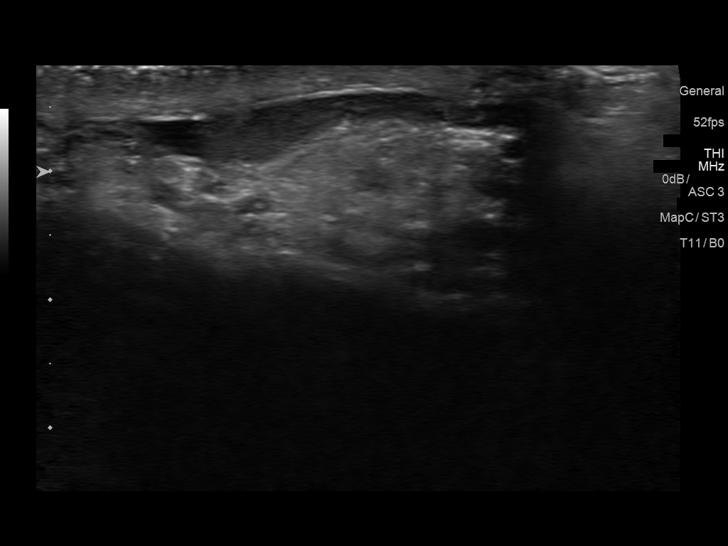
[im 27/58]
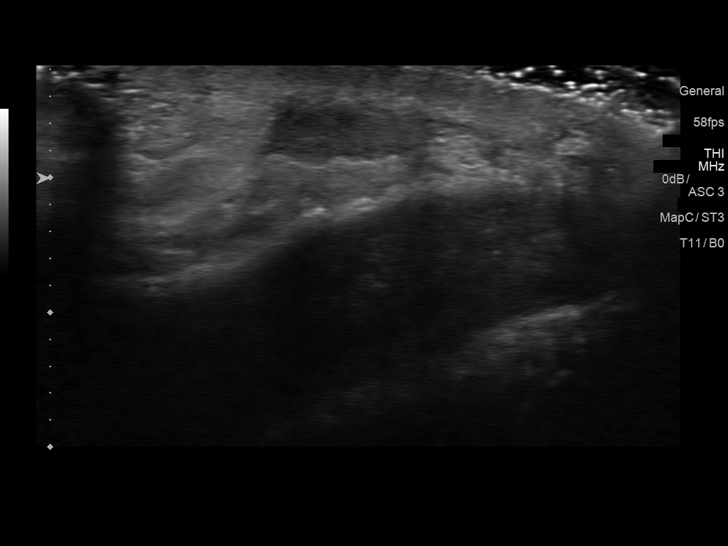
[im 31/58]
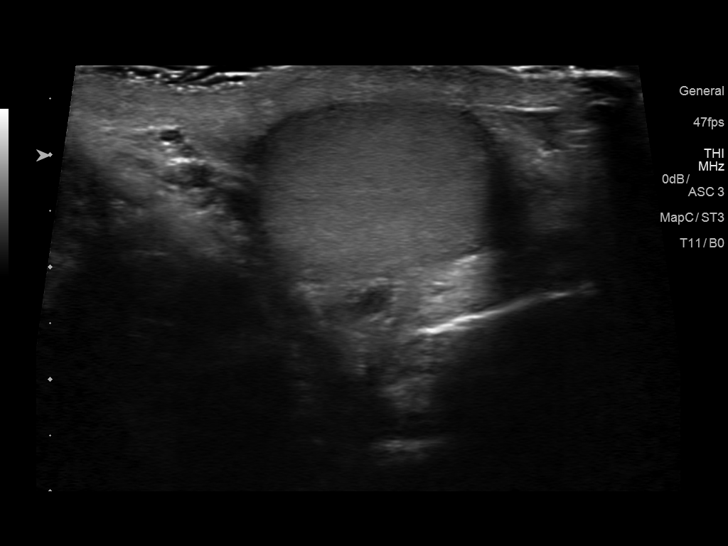
[im 36/58]
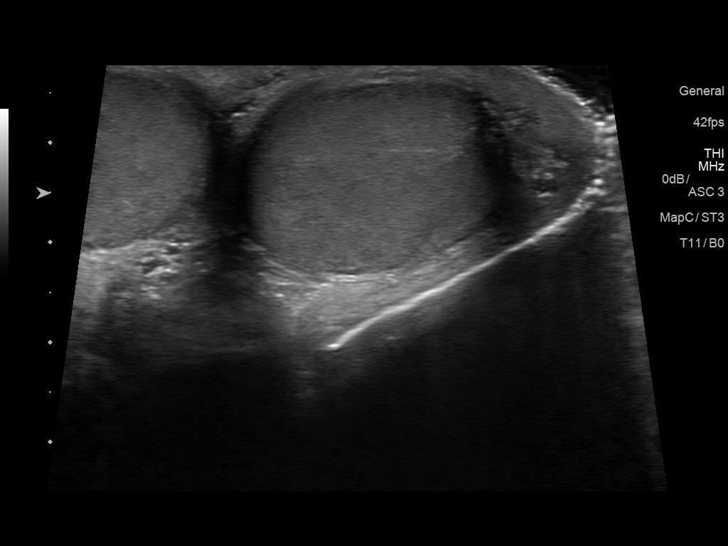
[im 39/58]
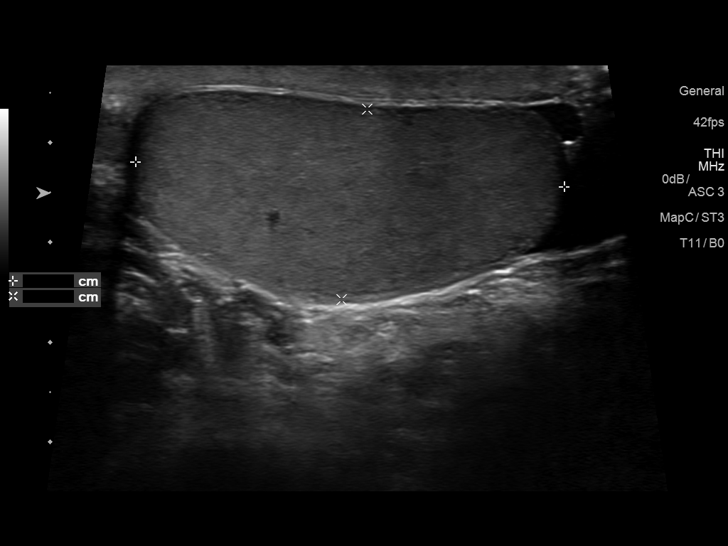
[im 43/58]
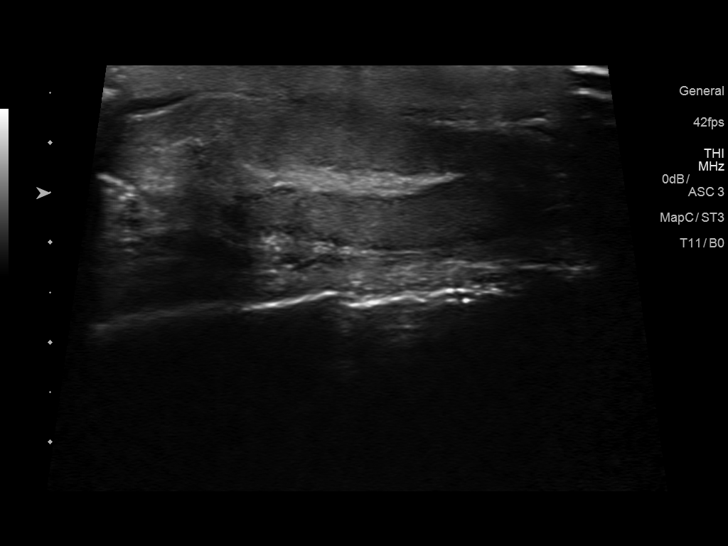
[im 48/58]
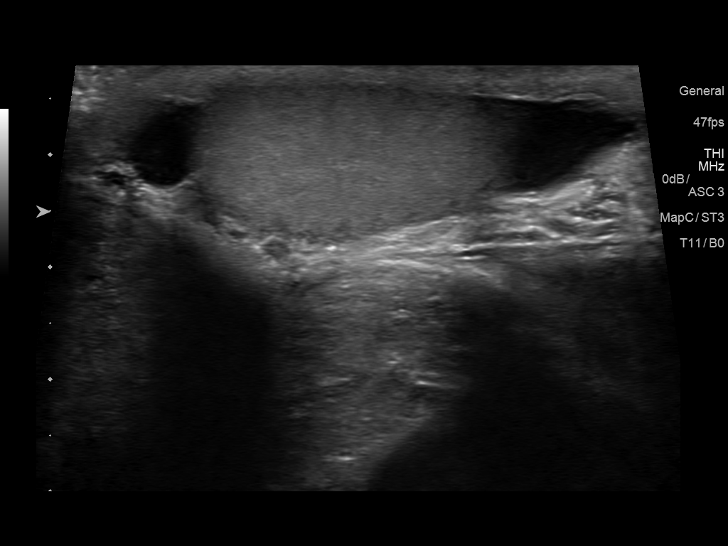
[im 53/58]
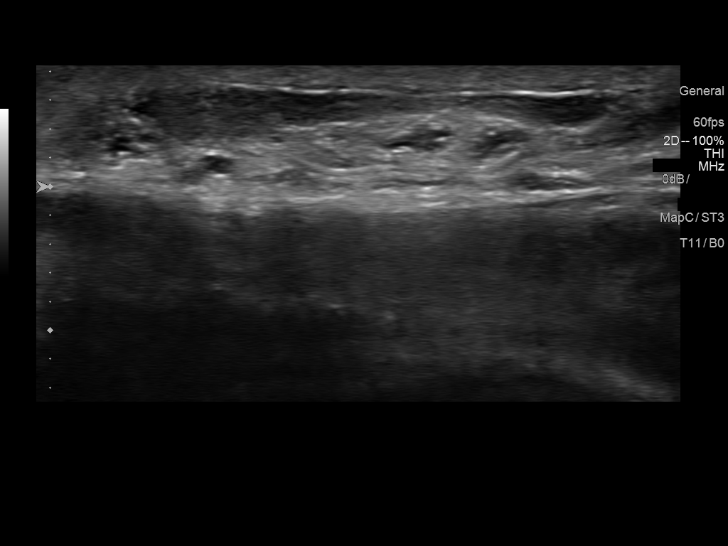
[im 58/58]
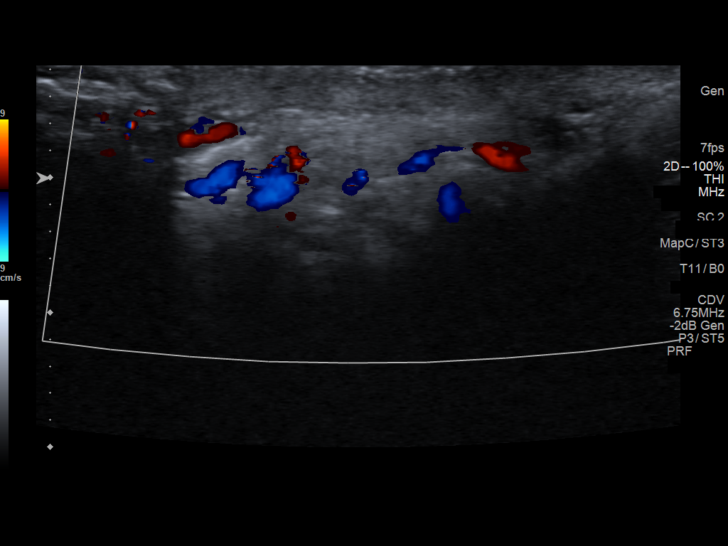

[14 of 25 positions shown; findings below may reference images not displayed]

FINDINGS: Right testicle

Measurements: 4.1 x 2.2 x 2.8 cm. No mass or microlithiasis
visualized.

Left testicle

Measurements: 4.3 x 1.9 x 2.7 cm. No mass or microlithiasis
visualized.

Right epididymis:  Normal in size and appearance.

Left epididymis:  Normal in size and appearance.

Hydrocele:  None visualized.

Varicocele:  None visualized.

Pulsed Doppler interrogation of both testes demonstrates normal low
resistance arterial and venous waveforms bilaterally.
IMPRESSION: 1. The testes exhibit normal echotexture and vascularity. No
testicular masses are observed.
2. The epididymal structures are normal. There is no evidence of a
epididymal cyst or acute epididymitis.
3. There is no hydrocele nor varicocele.

## 2018-05-27 ENCOUNTER — Other Ambulatory Visit: Payer: Self-pay

## 2018-05-27 ENCOUNTER — Emergency Department (HOSPITAL_COMMUNITY)
Admission: EM | Admit: 2018-05-27 | Discharge: 2018-05-27 | Disposition: A | Discharge status: Home / Self Care | Payer: Self-pay | Attending: Emergency Medicine | Admitting: Emergency Medicine

## 2018-05-27 ENCOUNTER — Encounter (HOSPITAL_COMMUNITY): Payer: Self-pay | Admitting: Emergency Medicine

## 2018-05-27 DIAGNOSIS — Z20828 Contact with and (suspected) exposure to other viral communicable diseases: Secondary | ICD-10-CM | POA: Insufficient documentation

## 2018-05-27 DIAGNOSIS — H6123 Impacted cerumen, bilateral: Secondary | ICD-10-CM | POA: Insufficient documentation

## 2018-05-27 DIAGNOSIS — J029 Acute pharyngitis, unspecified: Secondary | ICD-10-CM | POA: Insufficient documentation

## 2018-05-27 DIAGNOSIS — F17210 Nicotine dependence, cigarettes, uncomplicated: Secondary | ICD-10-CM | POA: Insufficient documentation

## 2018-05-27 DIAGNOSIS — Z79899 Other long term (current) drug therapy: Secondary | ICD-10-CM | POA: Insufficient documentation

## 2018-05-27 DIAGNOSIS — R509 Fever, unspecified: Secondary | ICD-10-CM | POA: Insufficient documentation

## 2018-05-27 LAB — GROUP A STREP BY PCR: Group A Strep by PCR: NOT DETECTED

## 2018-05-27 MED ORDER — DEXAMETHASONE 4 MG PO TABS
10.0000 mg | ORAL_TABLET | Freq: Once | ORAL | Status: AC
  Administered 2018-05-27: 10 mg via ORAL
  Filled 2018-05-27: qty 3

## 2018-05-27 MED ORDER — ACETAMINOPHEN 325 MG PO TABS
650.0000 mg | ORAL_TABLET | Freq: Once | ORAL | Status: AC | PRN
  Administered 2018-05-27: 650 mg via ORAL
  Filled 2018-05-27: qty 2

## 2018-05-27 MED ORDER — AMOXICILLIN 500 MG PO CAPS: 500.0000 mg | ORAL_CAPSULE | Freq: Two times a day (BID) | ORAL | 0 refills | Status: DC

## 2018-05-27 NOTE — ED Notes (Signed)
Patient is A&Ox4 at this time.  Patient in no signs of distress.  Please see providers note for complete history and physical exam.  

## 2018-05-27 NOTE — ED Notes (Signed)
Patient verbalizes understanding of discharge instructions. Opportunity for questioning and answers were provided. Armband removed by staff, pt discharged from ED.  

## 2018-05-27 NOTE — ED Provider Notes (Addendum)
[facility]   CSN: [id number] Arrival date & time: 05/27/18  2004    History   Chief Complaint Chief Complaint  Patient presents with  . Sore Throat  . Fever    HPI Zachary Meyers is a 28 y.o. male who presents with a 3-day history of fever and sore throat.  Patient denies any shortness of breath or new cough.  He reports he has a smoker's cough, however this is unchanged.  He has stayed [facility] lately except [facility], however he was feeling bad before this.  He reports getting strep throat frequently.  He has been taking Pepto-Bismol as he had some nausea.  He reports vomiting once, however this has not persisted.  He denies any abdominal pain or diarrhea.     HPI  Past Medical History:  Diagnosis Date  . Depression     Patient Active Problem List   Diagnosis Date Noted  . Psoriasis 09/13/2015  . Pain, dental 09/13/2015    Past Surgical History:  Procedure Laterality Date  . OPEN ANTERIOR SHOULDER RECONSTRUCTION     right         Home Medications    Prior to Admission medications   Medication Sig Start Date End Date Taking? Authorizing Provider  amoxicillin (AMOXIL) 500 MG capsule Take 1 capsule (500 mg total) by mouth 2 (two) times daily. 05/27/18   Rakwon Letourneau, Waylan BogaAlexandra M, PA-C  clindamycin (CLEOCIN) 300 MG capsule Take 1 capsule (300 mg total) by mouth 4 (four) times daily. X 7 days 08/27/16   Arby BarrettePfeiffer, Marcy, MD  ibuprofen (ADVIL,MOTRIN) 600 MG tablet Take 1 tablet (600 mg total) by mouth every 6 (six) hours as needed. 08/27/16   Arby BarrettePfeiffer, Marcy, MD  sertraline (ZOLOFT) 25 MG tablet Take 25 mg by mouth daily.    [provider]    Family History No family history on file.  Social History Social History   Tobacco Use  . Smoking status: Current Every Day Smoker    Packs/day: 0.25    Types: Cigarettes  . Smokeless tobacco: Former Engineer, waterUser  Substance Use Topics  . Alcohol use: Yes   Comment: occ  . Drug use: No     Allergies   Patient has no known allergies.   Review of Systems Review of Systems  Constitutional: Positive for fever.  HENT: Positive for sore throat.   Respiratory: Negative for cough.      Physical Exam Updated Vital Signs BP 103/66 (BP Location: Right Arm)   Pulse 88   Temp 99.1 F (37.3 C) (Oral)   Resp 18   SpO2 100%   Physical Exam Vitals signs and nursing note reviewed.  Constitutional:      General: He is not in acute distress.    Appearance: He is well-developed. He is not diaphoretic.  HENT:     Head: Normocephalic and atraumatic.     Right Ear: There is impacted cerumen.     Left Ear: There is impacted cerumen.     Mouth/Throat:     Pharynx: Oropharyngeal exudate and posterior oropharyngeal erythema present.     Tonsils: No tonsillar abscesses. 2+ on the right. 2+ on the left.  Eyes:     General: No scleral icterus.       Right eye: No discharge.        Left eye: No discharge.     Conjunctiva/sclera: Conjunctivae normal.     Pupils: Pupils are equal, round,  and reactive to light.  Neck:     Musculoskeletal: Normal range of motion and neck supple.     Thyroid: No thyromegaly.  Cardiovascular:     Rate and Rhythm: Normal rate and regular rhythm.     Heart sounds: Normal heart sounds. No murmur. No friction rub. No gallop.   Pulmonary:     Effort: Pulmonary effort is normal. No respiratory distress.     Breath sounds: Normal breath sounds. No stridor. No wheezing or rales.  Abdominal:     General: Bowel sounds are normal. There is no distension.     Palpations: Abdomen is soft.     Tenderness: There is no abdominal tenderness. There is no guarding or rebound.  Lymphadenopathy:     Cervical: No cervical adenopathy.  Skin:    General: Skin is warm and dry.     Coloration: Skin is not pale.     Findings: No rash.  Neurological:     Mental Status: He is alert.     Coordination: Coordination normal.      ED  Treatments / Results  Labs (all labs ordered are listed, but only abnormal results are displayed) Labs Reviewed  GROUP A STREP BY PCR  NOVEL CORONAVIRUS, NAA (HOSPITAL ORDER, SEND-OUT TO REF LAB)    EKG None  Radiology No results found.  Procedures Procedures (including critical care time)  Medications Ordered in ED Medications  acetaminophen (TYLENOL) tablet 650 mg (650 mg Oral Given 05/27/18 2035)  dexamethasone (DECADRON) tablet 10 mg (10 mg Oral Given 05/27/18 2158)     Initial Impression / Assessment and Plan / ED Course  I have reviewed the triage vital signs and the nursing notes.  Pertinent labs & imaging results that were available during my care of the patient were reviewed by me and considered in my medical decision making (see chart for details).        Patient presenting with sore throat and fever only.  Strep PCR is negative.  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  Single dose Decadron given in the ED.  Return precautions discussed.  Patient recommended to take Tylenol instead of ibuprofen for fever.  Return precautions discussed.  Patient understands and agrees with plan.  Patient vitals stable throughout ED course and discharged in satisfactory condition.  Zachary Meyers was evaluated in Emergency Department on 05/27/2018 for the symptoms described in the history of present illness. He was evaluated in the context of the global COVID-19 pandemic, which necessitated consideration that the patient might be at risk for infection with the SARS-CoV-2 virus that causes COVID-19. Institutional protocols and algorithms that pertain to the evaluation of patients at risk for COVID-19 are in a state of rapid change based on information released by regulatory bodies including the CDC and federal and state organizations. These policies and algorithms were followed during the  patient's care in the ED.   Final Clinical Impressions(s) / ED Diagnoses   Final diagnoses:  Sore throat  Fever, unspecified fever cause    ED Discharge Orders         Ordered    amoxicillin (AMOXIL) 500 MG capsule  2 times daily     05/27/18 2222           Emi Holes, PA-C 05/27/18 2244    Melene Plan, DO 05/27/18 2250    Emi Holes, PA-C 05/27/18 2334    Adela Lank,  Dan, DO 05/28/18 1514

## 2018-05-27 NOTE — Discharge Instructions (Addendum)
Take amoxicillin as prescribed until completed.  Take Tylenol as prescribed at the counter, as needed for fever and pain.  Make sure to stay well-hydrated.  You will be called in 2 to 4 days with your coronavirus test results.  Please stay quarantined at home until your symptoms have resolved for 3 days.  Do not share food or drink.  Make sure to replace your toothbrush after 3 days on the antibiotic.  Please return the emergency department if you develop any new or worsening symptoms including severe shortness of breath, lockjaw, or significant swelling in your neck/throat limiting your ability to swallow your own saliva.

## 2018-05-27 NOTE — ED Triage Notes (Signed)
Pt has had sore throat and fever for 3 days. States it is hard to swallow. Pts throat has exudate. No SOB or cough

## 2018-05-29 LAB — NOVEL CORONAVIRUS, NAA (HOSP ORDER, SEND-OUT TO REF LAB; TAT 18-24 HRS): SARS-CoV-2, NAA: NOT DETECTED

## 2018-07-05 ENCOUNTER — Encounter (HOSPITAL_COMMUNITY): Payer: Self-pay

## 2018-07-05 ENCOUNTER — Emergency Department (HOSPITAL_COMMUNITY)
Admission: EM | Admit: 2018-07-05 | Discharge: 2018-07-05 | Disposition: A | Discharge status: Home / Self Care | Payer: Medicaid Other | Attending: Emergency Medicine | Admitting: Emergency Medicine

## 2018-07-05 ENCOUNTER — Other Ambulatory Visit: Payer: Self-pay

## 2018-07-05 DIAGNOSIS — Z79899 Other long term (current) drug therapy: Secondary | ICD-10-CM | POA: Insufficient documentation

## 2018-07-05 DIAGNOSIS — R1111 Vomiting without nausea: Secondary | ICD-10-CM

## 2018-07-05 DIAGNOSIS — J029 Acute pharyngitis, unspecified: Secondary | ICD-10-CM | POA: Insufficient documentation

## 2018-07-05 DIAGNOSIS — F17210 Nicotine dependence, cigarettes, uncomplicated: Secondary | ICD-10-CM | POA: Insufficient documentation

## 2018-07-05 MED ORDER — ACETAMINOPHEN 325 MG PO TABS
650.0000 mg | ORAL_TABLET | Freq: Once | ORAL | Status: AC
  Administered 2018-07-05: 650 mg via ORAL
  Filled 2018-07-05: qty 2

## 2018-07-05 MED ORDER — ONDANSETRON 4 MG PO TBDP
4.0000 mg | ORAL_TABLET | Freq: Once | ORAL | Status: AC
  Administered 2018-07-05: 4 mg via ORAL
  Filled 2018-07-05: qty 1

## 2018-07-05 MED ORDER — ONDANSETRON 4 MG PO TBDP: 4.0000 mg | ORAL_TABLET | Freq: Three times a day (TID) | ORAL | 0 refills | Status: DC | PRN

## 2018-07-05 MED ORDER — AMOXICILLIN 500 MG PO CAPS: 1000.0000 mg | ORAL_CAPSULE | Freq: Every day | ORAL | 0 refills | Status: AC

## 2018-07-05 MED ORDER — LIDOCAINE VISCOUS HCL 2 % MT SOLN: 15.0000 mL | OROMUCOSAL | 0 refills | Status: DC | PRN

## 2018-07-05 MED ORDER — DEXAMETHASONE SODIUM PHOSPHATE 10 MG/ML IJ SOLN
10.0000 mg | Freq: Once | INTRAMUSCULAR | Status: AC
  Administered 2018-07-05: 10 mg via INTRAMUSCULAR
  Filled 2018-07-05: qty 1

## 2018-07-05 NOTE — ED Triage Notes (Addendum)
Patient c/o sore throat, fever, emesis congestion. Since yesterday. Patient states in May he had similar symptoms and was prescribed antibiotics.

## 2018-07-05 NOTE — ED Provider Notes (Signed)
[facility]-EMERGENCY DEPT Provider Note   CSN: [id number] Arrival date & time: 07/05/18  1231    History   Chief Complaint Chief Complaint  Patient presents with   Sore Throat   Emesis   Chills    HPI Zachary Meyers is a 28 y.o. male with a past medical history of depression, presents to ED for 1 day history of sore throat, nonbloody, nonbilious emesis, chills.  No sick contacts with similar symptoms.  He has not taken any medications to help with his symptoms.  Patient reports strep infections in the past.  No known sick contacts with COVID-19.  He denies any trismus, drooling, chest pain, abdominal pain, diarrhea or recent travel. No changes from his baseline cough.     HPI  Past Medical History:  Diagnosis Date   Depression     Patient Active Problem List   Diagnosis Date Noted   Psoriasis 09/13/2015   Pain, dental 09/13/2015    Past Surgical History:  Procedure Laterality Date   OPEN ANTERIOR SHOULDER RECONSTRUCTION     right         Home Medications    Prior to Admission medications   Medication Sig Start Date End Date Taking? Authorizing Provider  amoxicillin (AMOXIL) 500 MG capsule Take 2 capsules (1,000 mg total) by mouth daily for 10 days. 07/05/18 07/15/18  Tequan Redmon, PA-C  clindamycin (CLEOCIN) 300 MG capsule Take 1 capsule (300 mg total) by mouth 4 (four) times daily. X 7 days 08/27/16   Arby BarrettePfeiffer, Marcy, MD  ibuprofen (ADVIL,MOTRIN) 600 MG tablet Take 1 tablet (600 mg total) by mouth every 6 (six) hours as needed. 08/27/16   Arby BarrettePfeiffer, Marcy, MD  lidocaine (XYLOCAINE) 2 % solution Use as directed 15 mLs in the mouth or throat as needed for mouth pain. 07/05/18   Oscar Hank, PA-C  ondansetron (ZOFRAN ODT) 4 MG disintegrating tablet Take 1 tablet (4 mg total) by mouth every 8 (eight) hours as needed for nausea or vomiting. 07/05/18   Najma Bozarth, PA-C  sertraline (ZOLOFT) 25 MG tablet Take 25 mg by mouth daily.    [provider]    Family History Family History  Problem Relation Age of Onset   Healthy Mother    Healthy Father     Social History Social History   Tobacco Use   Smoking status: Current Every Day Smoker    Packs/day: 0.10    Types: Cigarettes   Smokeless tobacco: Former NeurosurgeonUser  Substance Use Topics   Alcohol use: Yes    Comment: occ   Drug use: No     Allergies   Patient has no known allergies.   Review of Systems Review of Systems  Constitutional: Positive for chills. Negative for appetite change and fever.  HENT: Positive for sore throat. Negative for ear pain, rhinorrhea and sneezing.   Eyes: Negative for photophobia and visual disturbance.  Respiratory: Negative for cough, chest tightness, shortness of breath and wheezing.   Cardiovascular: Negative for chest pain and palpitations.  Gastrointestinal: Positive for vomiting. Negative for abdominal pain, blood in stool, constipation, diarrhea and nausea.  Genitourinary: Negative for dysuria, hematuria and urgency.  Musculoskeletal: Negative for myalgias.  Skin: Negative for rash.  Neurological: Negative for dizziness, weakness and light-headedness.     Physical Exam Updated Vital Signs BP 105/71 (BP Location: Left Arm)    Pulse 96    Temp 100.2 F (37.9 C) (Oral)    Resp 18  Ht 5\' 6"  (1.676 m)    Wt 61.2 kg    SpO2 98%    BMI 21.79 kg/m   Physical Exam Vitals signs and nursing note reviewed.  Constitutional:      General: He is not in acute distress.    Appearance: He is well-developed.  HENT:     Head: Normocephalic and atraumatic.     Nose: Nose normal.     Mouth/Throat:     Tonsils: Tonsillar exudate present. 2+ on the right. 2+ on the left.     Comments: Bilaterally, symmetrically enlarged tonsils with exudates.  Patient does not appear to be in acute distress. No trismus or drooling present. No pooling of secretions. Patient is tolerating secretions and is not in respiratory distress. No neck  pain or tenderness to palpation of the neck. Full active and passive range of motion of the neck. No evidence of RPA or PTA. Eyes:     General: No scleral icterus.       Left eye: No discharge.     Conjunctiva/sclera: Conjunctivae normal.  Neck:     Musculoskeletal: Normal range of motion and neck supple.  Cardiovascular:     Rate and Rhythm: Normal rate and regular rhythm.     Heart sounds: Normal heart sounds. No murmur. No friction rub. No gallop.   Pulmonary:     Effort: Pulmonary effort is normal. No respiratory distress.     Breath sounds: Normal breath sounds.  Abdominal:     General: Bowel sounds are normal. There is no distension.     Palpations: Abdomen is soft.     Tenderness: There is no abdominal tenderness. There is no guarding.  Musculoskeletal: Normal range of motion.  Skin:    General: Skin is warm and dry.     Findings: No rash.  Neurological:     Mental Status: He is alert.     Motor: No abnormal muscle tone.     Coordination: Coordination normal.      ED Treatments / Results  Labs (all labs ordered are listed, but only abnormal results are displayed) Labs Reviewed - No data to display  EKG None  Radiology No results found.  Procedures Procedures (including critical care time)  Medications Ordered in ED Medications  dexamethasone (DECADRON) injection 10 mg (10 mg Intramuscular Given 07/05/18 1346)  ondansetron (ZOFRAN-ODT) disintegrating tablet 4 mg (4 mg Oral Given 07/05/18 1346)  acetaminophen (TYLENOL) tablet 650 mg (650 mg Oral Given 07/05/18 1346)     Initial Impression / Assessment and Plan / ED Course  I have reviewed the triage vital signs and the nursing notes.  Pertinent labs & imaging results that were available during my care of the patient were reviewed by me and considered in my medical decision making (see chart for details).        28 year old male with a past medical history of recurrent strep infections presents to ED for sore  throat, emesis and chills since yesterday. On exam tonsils are bilaterally, symmetrically enlarged with exudates.  No signs of RPA or PTA, he is tolerating secretions with normal voice.  Temperature of 100.2, tachycardia 108 initially.  He has not taken any recent antipyretics.  His past strep test have been negative so I do not see indication for this as due to his history we will treat for strep.  Given Decadron to help with swelling, Zofran for nausea and Tylenol for fever.  Vital signs improved with medications.  Abdomen is soft,  nontender nondistended.  Patient comfortable with discharge home with antibiotics, antiemetics and Magic mouthwash.  Patient is hemodynamically stable, in NAD, and able to ambulate in the ED. Evaluation does not show pathology that would require ongoing emergent intervention or inpatient treatment. I explained the diagnosis to the patient. Pain has been managed and has no complaints prior to discharge. Patient is comfortable with above plan and is stable for discharge at this time. All questions were answered prior to disposition. Strict return precautions for returning to the ED were discussed. Encouraged follow up with PCP.   An After Visit Summary was printed and given to the patient.   Portions of this note were generated with Scientist, clinical (histocompatibility and immunogenetics)Dragon dictation software. Dictation errors may occur despite best attempts at proofreading.   Final Clinical Impressions(s) / ED Diagnoses   Final diagnoses:  Pharyngitis, unspecified etiology  Non-intractable vomiting without nausea, unspecified vomiting type    ED Discharge Orders         Ordered    amoxicillin (AMOXIL) 500 MG capsule  Daily     07/05/18 1315    lidocaine (XYLOCAINE) 2 % solution  As needed     07/05/18 1315    ondansetron (ZOFRAN ODT) 4 MG disintegrating tablet  Every 8 hours PRN     07/05/18 1315           Dietrich PatesKhatri, Airelle Everding, PA-C 07/05/18 1356    Tegeler, Canary Brimhristopher J, MD 07/05/18 1549

## 2018-07-05 NOTE — Discharge Instructions (Signed)
Take the amoxicillin. Take Tylenol as needed for fever/chills and pain. Swish and spit the lidocaine solution to help with throat discomfort. Do not swallow. Return to the ED if you start to have worsening symptoms, trouble breathing or trouble swallowing, chest pain or trouble moving your neck.

## 2018-08-10 ENCOUNTER — Emergency Department (HOSPITAL_COMMUNITY): Payer: No Typology Code available for payment source

## 2018-08-10 ENCOUNTER — Emergency Department (HOSPITAL_COMMUNITY)
Admission: EM | Admit: 2018-08-10 | Discharge: 2018-08-10 | Disposition: A | Discharge status: Home / Self Care | Payer: No Typology Code available for payment source | Attending: Emergency Medicine | Admitting: Emergency Medicine

## 2018-08-10 ENCOUNTER — Encounter (HOSPITAL_COMMUNITY): Payer: Self-pay | Admitting: *Deleted

## 2018-08-10 ENCOUNTER — Other Ambulatory Visit: Payer: Self-pay

## 2018-08-10 DIAGNOSIS — F17210 Nicotine dependence, cigarettes, uncomplicated: Secondary | ICD-10-CM | POA: Insufficient documentation

## 2018-08-10 DIAGNOSIS — M7918 Myalgia, other site: Secondary | ICD-10-CM | POA: Insufficient documentation

## 2018-08-10 DIAGNOSIS — Z79899 Other long term (current) drug therapy: Secondary | ICD-10-CM | POA: Diagnosis not present

## 2018-08-10 MED ORDER — IBUPROFEN 600 MG PO TABS: 600.0000 mg | ORAL_TABLET | Freq: Four times a day (QID) | ORAL | 0 refills | Status: DC | PRN

## 2018-08-10 MED ORDER — METHOCARBAMOL 750 MG PO TABS: 750.0000 mg | ORAL_TABLET | Freq: Three times a day (TID) | ORAL | 0 refills | Status: DC | PRN
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
## 2018-10-08 ENCOUNTER — Encounter (HOSPITAL_COMMUNITY): Payer: Self-pay | Admitting: *Deleted

## 2018-10-08 ENCOUNTER — Other Ambulatory Visit: Payer: Self-pay

## 2018-10-08 ENCOUNTER — Emergency Department (HOSPITAL_COMMUNITY)
Admission: EM | Admit: 2018-10-08 | Discharge: 2018-10-08 | Disposition: A | Discharge status: Home / Self Care | Payer: Medicaid Other | Attending: Emergency Medicine | Admitting: Emergency Medicine

## 2018-10-08 DIAGNOSIS — J029 Acute pharyngitis, unspecified: Secondary | ICD-10-CM

## 2018-10-08 DIAGNOSIS — F17210 Nicotine dependence, cigarettes, uncomplicated: Secondary | ICD-10-CM | POA: Insufficient documentation

## 2018-10-08 DIAGNOSIS — Z79899 Other long term (current) drug therapy: Secondary | ICD-10-CM | POA: Insufficient documentation

## 2018-10-08 LAB — GROUP A STREP BY PCR: Group A Strep by PCR: NOT DETECTED

## 2018-10-08 MED ORDER — IBUPROFEN 600 MG PO TABS: 600.0000 mg | ORAL_TABLET | Freq: Four times a day (QID) | ORAL | 0 refills | Status: DC | PRN

## 2018-10-08 MED ORDER — DEXAMETHASONE SODIUM PHOSPHATE 10 MG/ML IJ SOLN
10.0000 mg | Freq: Once | INTRAMUSCULAR | Status: AC
  Administered 2018-10-08: 22:00:00 10 mg via INTRAMUSCULAR
  Filled 2018-10-08: qty 1

## 2018-10-08 MED ORDER — PENICILLIN G BENZATHINE 1200000 UNIT/2ML IM SUSP
1.2000 10*6.[IU] | Freq: Once | INTRAMUSCULAR | Status: AC
  Administered 2018-10-08: 22:00:00 1.2 10*6.[IU] via INTRAMUSCULAR
  Filled 2018-10-08: qty 2
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
## 2018-10-08 NOTE — ED Triage Notes (Signed)
Pt c/o sore throat; pt's left tonsil is swollen and red with patches of white on his tonsils; pt states he as has been running fevers over the weekend; pt states the highest temp was 101; pt states he started off with a cough last week; pt states he no longer has a cough, just the sore throat

## 2018-10-08 NOTE — Discharge Instructions (Addendum)
We have given you antibiotics, this will treat strep throat, you do not need to take any more medication at home other than ibuprofen for pain swelling and fever.  Seek medical exam for severe or worsening symptoms.  Please be aware that you are contagious to others.

## 2020-06-17 ENCOUNTER — Other Ambulatory Visit: Payer: Self-pay

## 2020-06-17 ENCOUNTER — Ambulatory Visit
Admission: EM | Admit: 2020-06-17 | Discharge: 2020-06-17 | Disposition: A | Discharge status: Home / Self Care | Payer: Medicaid Other | Attending: Emergency Medicine | Admitting: Emergency Medicine

## 2020-06-17 DIAGNOSIS — L03116 Cellulitis of left lower limb: Secondary | ICD-10-CM

## 2020-06-17 DIAGNOSIS — L409 Psoriasis, unspecified: Secondary | ICD-10-CM

## 2020-06-17 MED ORDER — IBUPROFEN 800 MG PO TABS: 800.0000 mg | ORAL_TABLET | Freq: Three times a day (TID) | ORAL | 0 refills | Status: AC

## 2020-06-17 MED ORDER — TRIAMCINOLONE ACETONIDE 0.1 % EX CREA: 1.0000 "application " | TOPICAL_CREAM | Freq: Two times a day (BID) | CUTANEOUS | 0 refills | Status: AC

## 2020-06-17 MED ORDER — DOXYCYCLINE HYCLATE 100 MG PO CAPS: 100.0000 mg | ORAL_CAPSULE | Freq: Two times a day (BID) | ORAL | 0 refills | Status: AC

## 2020-06-17 NOTE — ED Provider Notes (Signed)
[facility]    CSN: [id number] Arrival date & time: 06/17/20  1708      History   Chief Complaint Chief Complaint  Patient presents with   Wound Infection    HPI Zachary Meyers is a 30 y.o. male presenting today for evaluation of left knee pain.  Reports that he noticed a small area of redness to his left knee approximate 1 week ago.  Initially thought was a bug bite and felt that there is something needed to be drained, attempted to open it.  Since he has developed increased redness pain swelling and pustular drainage.  He denies any fevers.  Reports discomfort with moving knee, but reports he is still able to.  Chronic psoriasis-currently not using any prescription medicine.  HPI  Past Medical History:  Diagnosis Date   Depression     Patient Active Problem List   Diagnosis Date Noted   Psoriasis 09/13/2015   Pain, dental 09/13/2015    Past Surgical History:  Procedure Laterality Date   OPEN ANTERIOR SHOULDER RECONSTRUCTION     right        Home Medications    Prior to Admission medications   Medication Sig Start Date End Date Taking? Authorizing Provider  doxycycline (VIBRAMYCIN) 100 MG capsule Take 1 capsule (100 mg total) by mouth 2 (two) times daily for 10 days. 06/17/20 06/27/20 Yes Claudett Bayly C, PA-C  ibuprofen (ADVIL) 800 MG tablet Take 1 tablet (800 mg total) by mouth 3 (three) times daily. 06/17/20  Yes Keyairra Kolinski C, PA-C  triamcinolone cream (KENALOG) 0.1 % Apply 1 application topically 2 (two) times daily. 06/17/20  Yes Aimar Borghi, Junius Creamer, PA-C    Family History Family History  Problem Relation Age of Onset   Healthy Mother    Healthy Father     Social History Social History   Tobacco Use   Smoking status: Every Day    Packs/day: 0.10    Pack years: 0.00    Types: Cigarettes   Smokeless tobacco: Former  Building services engineer Use: Never used  Substance Use Topics   Alcohol use: Yes    Comment: occ   Drug use: No      Allergies   Patient has no known allergies.   Review of Systems Review of Systems  Constitutional:  Negative for fatigue and fever.  Eyes:  Negative for redness, itching and visual disturbance.  Respiratory:  Negative for shortness of breath.   Cardiovascular:  Negative for chest pain and leg swelling.  Gastrointestinal:  Negative for nausea and vomiting.  Musculoskeletal:  Positive for arthralgias and joint swelling. Negative for myalgias.  Skin:  Negative for color change, rash and wound.  Neurological:  Negative for dizziness, syncope, weakness, light-headedness and headaches.    Physical Exam Triage Vital Signs ED Triage Vitals  Enc Vitals Group     BP      Pulse      Resp      Temp      Temp src      SpO2      Weight      Height      Head Circumference      Peak Flow      Pain Score      Pain Loc      Pain Edu?      Excl. in GC?    No data found.  Updated Vital Signs BP 131/62 (BP Location: Left Arm)  Pulse 86   Temp 98.5 F (36.9 C) (Oral)   Resp 18   SpO2 96%   Visual Acuity Right Eye Distance:   Left Eye Distance:   Bilateral Distance:    Right Eye Near:   Left Eye Near:    Bilateral Near:     Physical Exam Vitals and nursing note reviewed.  Constitutional:      Appearance: He is well-developed.     Comments: No acute distress  HENT:     Head: Normocephalic and atraumatic.     Nose: Nose normal.  Eyes:     Conjunctiva/sclera: Conjunctivae normal.  Cardiovascular:     Rate and Rhythm: Normal rate.  Pulmonary:     Effort: Pulmonary effort is normal. No respiratory distress.  Abdominal:     General: There is no distension.  Musculoskeletal:        General: Normal range of motion.     Cervical back: Neck supple.     Comments: Left anterior and slightly medial aspect of left knee with area of erythema with central induration and fluctuance and small area of superficial pus, actively draining  Full active range of motion of left  knee with extension and flexion  Dorsalis pedis 2+  Skin:    General: Skin is warm and dry.     Comments: White/erythematous plaque-like lesions noted to bilateral anterior shins and forearms  Neurological:     Mental Status: He is alert and oriented to person, place, and time.     UC Treatments / Results  Labs (all labs ordered are listed, but only abnormal results are displayed) Labs Reviewed - No data to display  EKG   Radiology No results found.  Procedures Procedures (including critical care time)  Medications Ordered in UC Medications - No data to display  Initial Impression / Assessment and Plan / UC Course  I have reviewed the triage vital signs and the nursing notes.  Pertinent labs & imaging results that were available during my care of the patient were reviewed by me and considered in my medical decision making (see chart for details).     Cellulitis/abscess-initially on doxycycline, actively draining, does not warrant I&D at this time, anti-inflammatories for pain, vital signs stable, full active range of motion, erythema limited to around area of wound, do not suspect septic arthritis at this time. Psoriasis-prescribed triamcinolone to use Discussed strict return precautions. Patient verbalized understanding and is agreeable with plan.  Final Clinical Impressions(s) / UC Diagnoses   Final diagnoses:  Cellulitis of knee, left  Psoriasis     Discharge Instructions      Begin doxycycline twice daily for 10 days Tylenol and ibuprofen for pain Please go to emergency room if developing fevers, worsening redness swelling or pain  Triamcinolone cream for psoriasis      ED Prescriptions     Medication Sig Dispense Auth. Provider   doxycycline (VIBRAMYCIN) 100 MG capsule Take 1 capsule (100 mg total) by mouth 2 (two) times daily for 10 days. 20 capsule Javis Abboud C, PA-C   ibuprofen (ADVIL) 800 MG tablet Take 1 tablet (800 mg total) by mouth 3  (three) times daily. 21 tablet Burhan Barham C, PA-C   triamcinolone cream (KENALOG) 0.1 % Apply 1 application topically 2 (two) times daily. 453.6 g Lew Dawes, PA-C      PDMP not reviewed this encounter.   Lew Dawes, PA-C 06/18/20 1221

## 2020-06-17 NOTE — Discharge Instructions (Addendum)
Begin doxycycline twice daily for 10 days Tylenol and ibuprofen for pain Please go to emergency room if developing fevers, worsening redness swelling or pain  Triamcinolone cream for psoriasis

## 2020-06-17 NOTE — ED Triage Notes (Signed)
Pt states had a bug bite to lt knee a week ago. States he opened it with clippers d/t something black in it. States now the wound it red, tender, painful, and draining.

## 2020-07-17 ENCOUNTER — Emergency Department (HOSPITAL_COMMUNITY)
Admission: EM | Admit: 2020-07-17 | Discharge: 2020-07-17 | Disposition: A | Discharge status: Home / Self Care | Payer: Medicaid Other | Attending: Emergency Medicine | Admitting: Emergency Medicine

## 2020-07-17 ENCOUNTER — Other Ambulatory Visit: Payer: Self-pay

## 2020-07-17 ENCOUNTER — Emergency Department (HOSPITAL_COMMUNITY): Payer: Medicaid Other

## 2020-07-17 ENCOUNTER — Ambulatory Visit
Admission: EM | Admit: 2020-07-17 | Discharge: 2020-07-17 | Disposition: A | Discharge status: Home / Self Care | Payer: Medicaid Other

## 2020-07-17 DIAGNOSIS — S20212A Contusion of left front wall of thorax, initial encounter: Secondary | ICD-10-CM | POA: Insufficient documentation

## 2020-07-17 DIAGNOSIS — F17210 Nicotine dependence, cigarettes, uncomplicated: Secondary | ICD-10-CM | POA: Insufficient documentation

## 2020-07-17 DIAGNOSIS — T07XXXA Unspecified multiple injuries, initial encounter: Secondary | ICD-10-CM

## 2020-07-17 DIAGNOSIS — S5012XA Contusion of left forearm, initial encounter: Secondary | ICD-10-CM | POA: Insufficient documentation

## 2020-07-17 DIAGNOSIS — S7012XA Contusion of left thigh, initial encounter: Secondary | ICD-10-CM | POA: Insufficient documentation

## 2020-07-17 DIAGNOSIS — S40021A Contusion of right upper arm, initial encounter: Secondary | ICD-10-CM | POA: Insufficient documentation

## 2020-07-17 MED ORDER — ACETAMINOPHEN 500 MG PO TABS
1000.0000 mg | ORAL_TABLET | Freq: Once | ORAL | Status: AC
  Administered 2020-07-17: 1000 mg via ORAL
  Filled 2020-07-17: qty 2

## 2020-07-17 MED ORDER — IBUPROFEN 400 MG PO TABS
400.0000 mg | ORAL_TABLET | Freq: Once | ORAL | Status: AC
  Administered 2020-07-17: 400 mg via ORAL
  Filled 2020-07-17: qty 1
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
## 2020-07-17 NOTE — Discharge Instructions (Signed)
It was our pleasure to provide your ER care today - we hope that you feel better.  Icepack/cold to sore area.   Take acetaminophen or ibuprofen as need.   Follow up with primary care doctor in 1-2 weeks if symptoms fail to improve/resolve.  Return to ER if worse, new symptoms, new/severe pain, trouble breathing, severe abdominal pain, persistent vomiting, numbness/weakness, or other concern.
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
## 2021-01-04 ENCOUNTER — Emergency Department (HOSPITAL_COMMUNITY)
Admission: EM | Admit: 2021-01-04 | Discharge: 2021-01-05 | Disposition: A | Discharge status: Home / Self Care | Payer: Self-pay | Attending: Emergency Medicine | Admitting: Emergency Medicine

## 2021-01-04 ENCOUNTER — Encounter (HOSPITAL_COMMUNITY): Payer: Self-pay | Admitting: Emergency Medicine

## 2021-01-04 ENCOUNTER — Other Ambulatory Visit: Payer: Self-pay

## 2021-01-04 ENCOUNTER — Emergency Department (HOSPITAL_COMMUNITY): Payer: Self-pay

## 2021-01-04 DIAGNOSIS — M25562 Pain in left knee: Secondary | ICD-10-CM | POA: Insufficient documentation

## 2021-01-04 DIAGNOSIS — Z5321 Procedure and treatment not carried out due to patient leaving prior to being seen by health care provider: Secondary | ICD-10-CM | POA: Insufficient documentation

## 2021-01-04 NOTE — ED Provider Notes (Signed)
Emergency Medicine Provider Triage Evaluation Note  Zachary Meyers , a 31 y.o. male  was evaluated in triage.  Pt complains of KNEE PAIN.  Swelling in the left knee for the last 2 weeks.  He is unsure if he had an injury.  He has a history of psoriasis.  He is not on any disease modifying agents.  Review of Systems  Positive: Left knee pain Negative: Numbness or tingling  Physical Exam  BP 129/71 (BP Location: Right Arm)    Pulse 64    Temp 98 F (36.7 C) (Oral)    Resp 16    Ht 5\' 6"  (1.676 m)    Wt 65 kg    SpO2 100%    BMI 23.13 kg/m  Gen:   Awake, no distress   Resp:  Normal effort  MSK:   Moves extremities without difficulty  Other:  Large left knee effusion, tenderness to palpation, stable ligaments  Medical Decision Making  Medically screening exam initiated at 7:57 PM.  Appropriate orders placed.  was informed that the remainder of the evaluation will be completed by another provider, this initial triage assessment does not replace that evaluation, and the importance of remaining in the ED until their evaluation is complete.  Left knee effusion, x-ray ordered   Haynes Bast, PA-C 01/04/21 2205    [id number], MD 01/05/21 0020

## 2021-01-04 NOTE — ED Triage Notes (Signed)
Patient reports left knee pain for 2 weeks , denies injury or fall .

## 2021-01-05 NOTE — ED Notes (Signed)
Patient called x2 for vitals recheck with no response  

## 2021-07-29 ENCOUNTER — Emergency Department
Admission: EM | Admit: 2021-07-29 | Discharge: 2021-07-29 | Disposition: A | Discharge status: Home / Self Care | Payer: Medicaid Other | Attending: Emergency Medicine | Admitting: Emergency Medicine

## 2021-07-29 ENCOUNTER — Other Ambulatory Visit: Payer: Self-pay

## 2021-07-29 DIAGNOSIS — T63441A Toxic effect of venom of bees, accidental (unintentional), initial encounter: Secondary | ICD-10-CM | POA: Insufficient documentation

## 2021-07-29 DIAGNOSIS — T7840XA Allergy, unspecified, initial encounter: Secondary | ICD-10-CM

## 2021-07-29 MED ORDER — PREDNISONE 10 MG PO TABS: 40.0000 mg | ORAL_TABLET | Freq: Every day | ORAL | 0 refills | Status: AC

## 2021-07-29 MED ORDER — PREDNISONE 20 MG PO TABS
40.0000 mg | ORAL_TABLET | Freq: Once | ORAL | Status: AC
  Administered 2021-07-29: 40 mg via ORAL
  Filled 2021-07-29: qty 2

## 2021-07-29 MED ORDER — FAMOTIDINE 20 MG PO TABS: 20.0000 mg | ORAL_TABLET | Freq: Every day | ORAL | 0 refills | Status: AC

## 2021-07-29 MED ORDER — FAMOTIDINE 20 MG PO TABS
20.0000 mg | ORAL_TABLET | Freq: Once | ORAL | Status: AC
  Administered 2021-07-29: 20 mg via ORAL
  Filled 2021-07-29: qty 1

## 2021-07-29 NOTE — ED Provider Triage Note (Signed)
Emergency Medicine Provider Triage Evaluation Note  SHISHIR KRANTZ , a 31 y.o. male  was evaluated in triage.  Pt complains of yellow jacket sting today. C/o of facial swelling but no difficulty breathing.  No prior anaphylaxis with stings.  Took benadryl PTA.   Review of Systems  Positive: Facial swelling Negative: No SOB  Physical Exam  There were no vitals taken for this visit. Gen:   Awake, no distress  Talking without any difficulty or shortness of breath Resp:  Normal effort  Lungs are clear bilat.  MSK:   Moves extremities without difficulty  Other:    Medical Decision Making  Medically screening exam initiated at 2:30 PM.  Appropriate orders placed.  Haynes Bast was informed that the remainder of the evaluation will be completed by another provider, this initial triage assessment does not replace that evaluation, and the importance of remaining in the ED until their evaluation is complete.     Tommi Rumps, PA-C 07/29/21 1434

## 2021-07-29 NOTE — Discharge Instructions (Signed)
You may take the medications as prescribed if you continue to have swelling tomorrow.  Please return if you develop tongue or lip swelling or trouble breathing or swallowing.  You may continue to apply ice to the area as we discussed.  Please return for any new, worsening, or change in symptoms or other concerns.

## 2021-07-29 NOTE — ED Notes (Signed)
Pt presents to the ED with c/o Allergic reaction. Pt states he was stung by a bee earlier today. Pt states he took a benadryl earlier. Pt denies any difficulty breathing, respirations are regular and unlabored. Pt has minimal swelling in face. Pt talking in complete sentences at this time, pt NAD.

## 2021-07-29 NOTE — ED Notes (Signed)
Pt verbalized understanding of discharge instructions, prescriptions, and follow-up care instructions. Pt advised if symptoms worsen to return to ED. See paper chart for discharge signature.
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
## 2021-07-29 NOTE — ED Provider Notes (Signed)
[facility] Provider Note    Event Date/Time   First MD Initiated Contact with Patient 07/29/21 1723     (approximate)   History   Allergic Reaction   HPI  Zachary Meyers is a 31 y.o. male who presents today after he was stung by a yellow jacket.  Patient reports that this occurred approximately 5.5 hours prior to arrival.  He reports that he was stung between his eyes.  Has developed swelling around his right eye and itchiness.  He denies any difficulty breathing or swallowing.  He denies rash.  He denies abdominal pain, nausea, vomiting, diarrhea.  He has not had any history of anaphylaxis.     Physical Exam   Triage Vital Signs: ED Triage Vitals [07/29/21 1433]  Enc Vitals Group     BP (!) 138/91     Pulse Rate 65     Resp 17     Temp 97.9 F (36.6 C)     Temp Source Oral     SpO2 99 %     Weight      Height      Head Circumference      Peak Flow      Pain Score 2     Pain Loc      Pain Edu?      Excl. in GC?     Most recent vital signs: Vitals:   07/29/21 1433 07/29/21 1928  BP: (!) 138/91 (!) 130/92  Pulse: 65 73  Resp: 17 17  Temp: 97.9 F (36.6 C) 97.8 F (36.6 C)  SpO2: 99% 98%    Physical Exam Vitals and nursing note reviewed.  Constitutional:      General: Awake and alert. No acute distress.    Appearance: Normal appearance. The patient is normal weight.  HENT:     Head: Normocephalic and atraumatic.     Mouth: Mucous membranes are moist.  No tongue or lip swelling.  No intraoral swelling Eyes:     General: PERRL. Normal EOMs        Right eye: No discharge.        Left eye: No discharge.     Conjunctiva/sclera: Conjunctivae normal.  Cardiovascular:     Rate and Rhythm: Normal rate and regular rhythm.     Pulses: Normal pulses.     Heart sounds: Normal heart sounds Pulmonary:     Effort: Pulmonary effort is normal. No respiratory distress.     Breath sounds: Normal breath sounds.  No wheezing Abdominal:      Abdomen is soft. There is no abdominal tenderness. No rebound or guarding. No distention. Musculoskeletal:        General: No swelling. Normal range of motion.     Cervical back: Normal range of motion and neck supple.  Skin:    General: Skin is warm and dry.     Capillary Refill: Capillary refill takes less than 2 seconds.     Findings: No rash.  Neurological:     Mental Status: The patient is awake and alert.      ED Results / Procedures / Treatments   Labs (all labs ordered are listed, but only abnormal results are displayed) Labs Reviewed - No data to display   EKG     RADIOLOGY     PROCEDURES:  Critical Care performed:   Procedures   MEDICATIONS ORDERED IN ED: Medications  predniSONE (DELTASONE) tablet 40 mg (40 mg Oral Given 07/29/21 1925)  famotidine (PEPCID) tablet 20 mg (20 mg Oral Given 07/29/21 1925)     IMPRESSION / MDM / ASSESSMENT AND PLAN / ED COURSE  I reviewed the triage vital signs and the nursing notes.   Differential diagnosis includes, but is not limited to, allergic reaction, contact dermatitis.  No rash, nausea, vomiting, diarrhea, hemodynamic instability, tongue or lip swelling or difficulty breathing to suggest anaphylaxis.  Patient has swelling around his bilateral eyes, though more so on his right, likely dependent swelling from his yellowjacket sting.  He was treated symptomatically with H2 blockers and prednisone.  No tongue or lip swelling to suggest angioedema.  He has no history of anaphylaxis   Patient's presentation is most consistent with acute illness / injury with system symptoms.        FINAL CLINICAL IMPRESSION(S) / ED DIAGNOSES   Final diagnoses:  Allergic reaction, initial encounter  Bee sting, accidental or unintentional, initial encounter     Rx / DC Orders   ED Discharge Orders          Ordered    famotidine (PEPCID) 20 MG tablet  Daily        07/29/21 1958    predniSONE (DELTASONE) 10 MG tablet  Daily         07/29/21 1958             Note:  This document was prepared using Dragon voice recognition software and may include unintentional dictation errors.   Keturah Shavers 07/29/21 Delfin Edis, MD 07/30/21 [phone]

## 2023-12-12 IMAGING — CR DG KNEE COMPLETE 4+V*L*
4 series · 4 of 4 positions shown · non-contrast
Comparison: None.

CLINICAL DATA: Left knee pain for 2 weeks without known injury.

EXAM:
LEFT KNEE - COMPLETE 4+ VIEW

[knee ap]
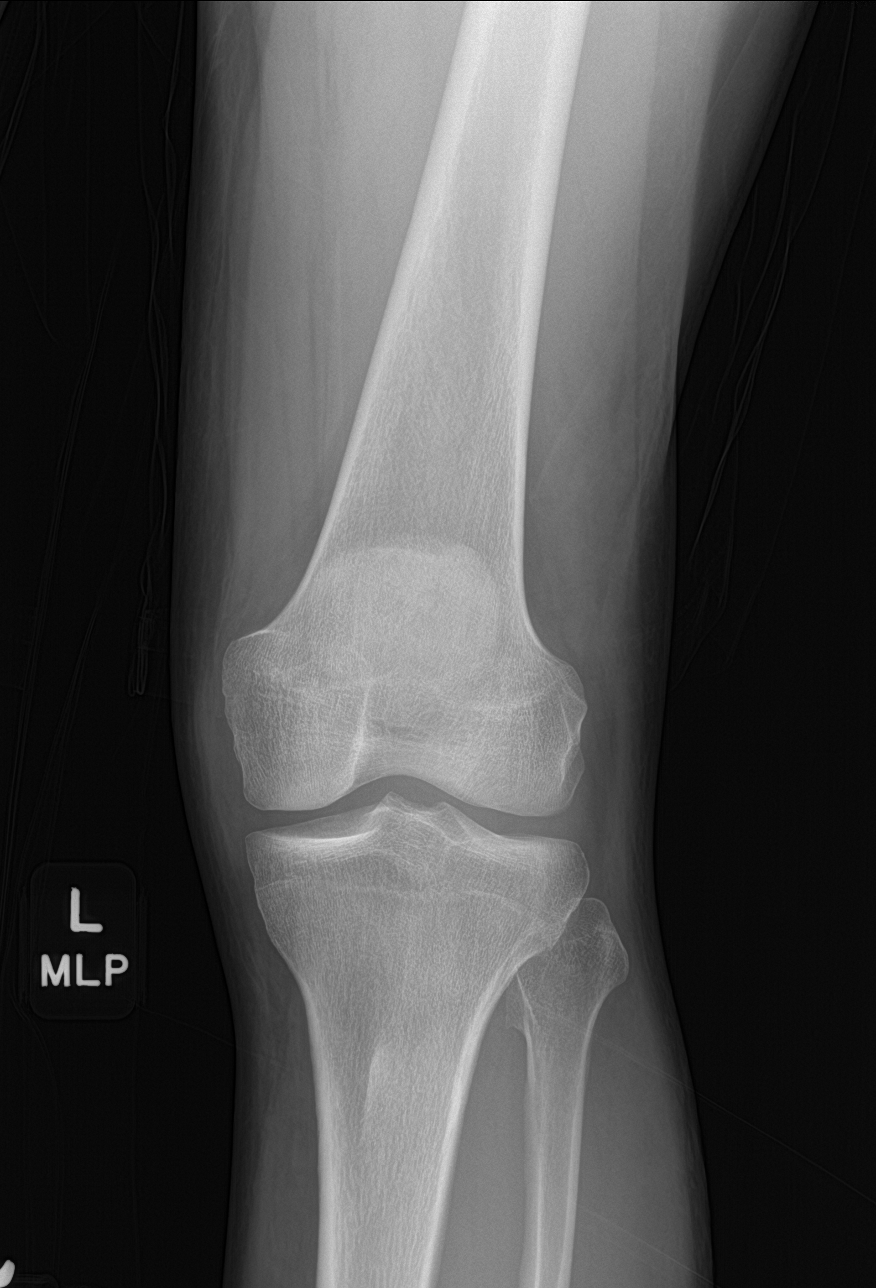

[knee lat]
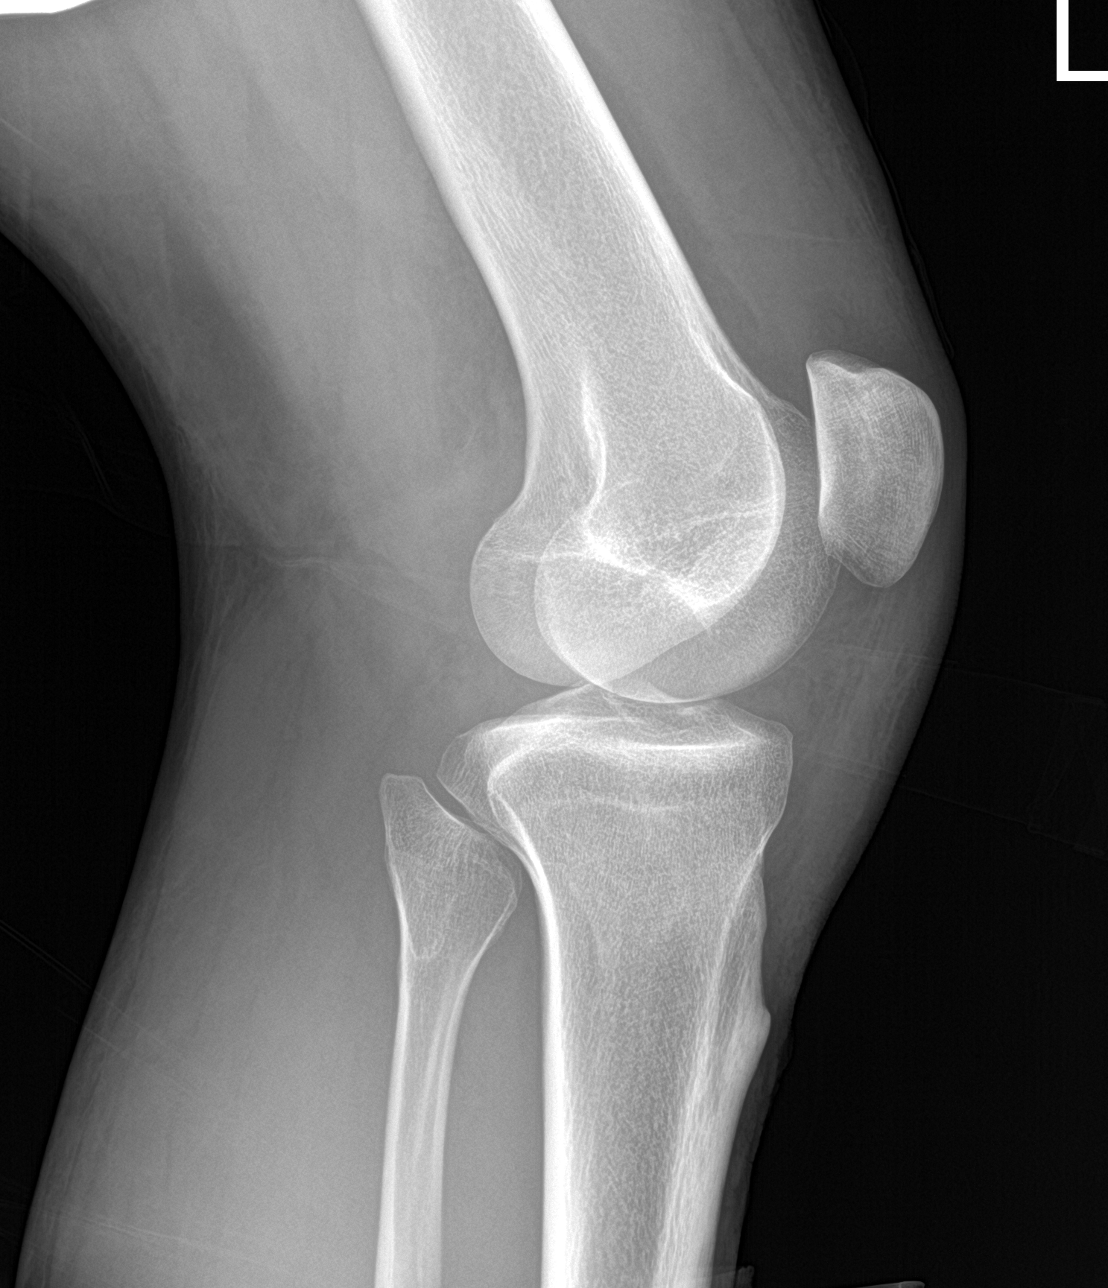

[knee obl (1 of 2)]
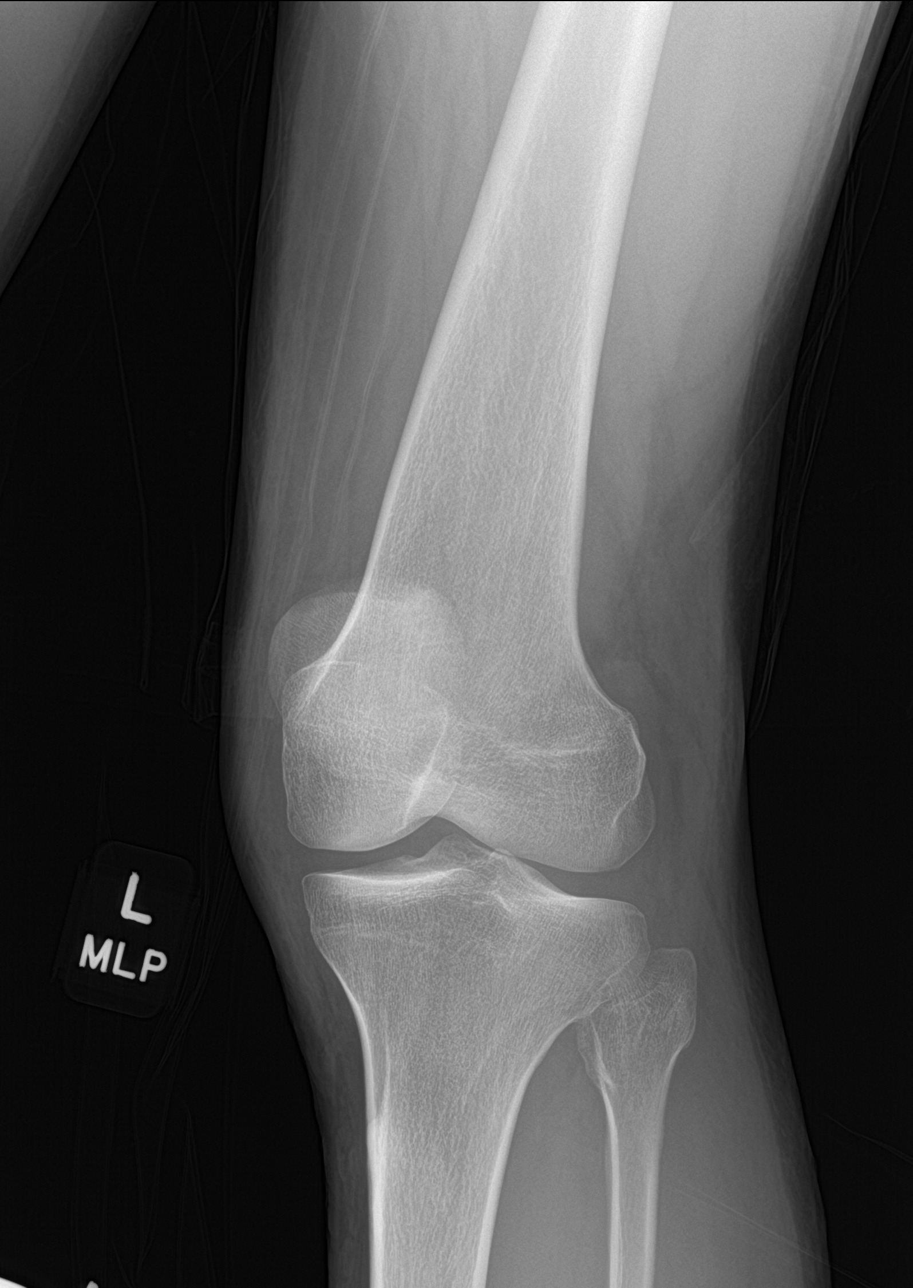

[knee obl (2 of 2)]
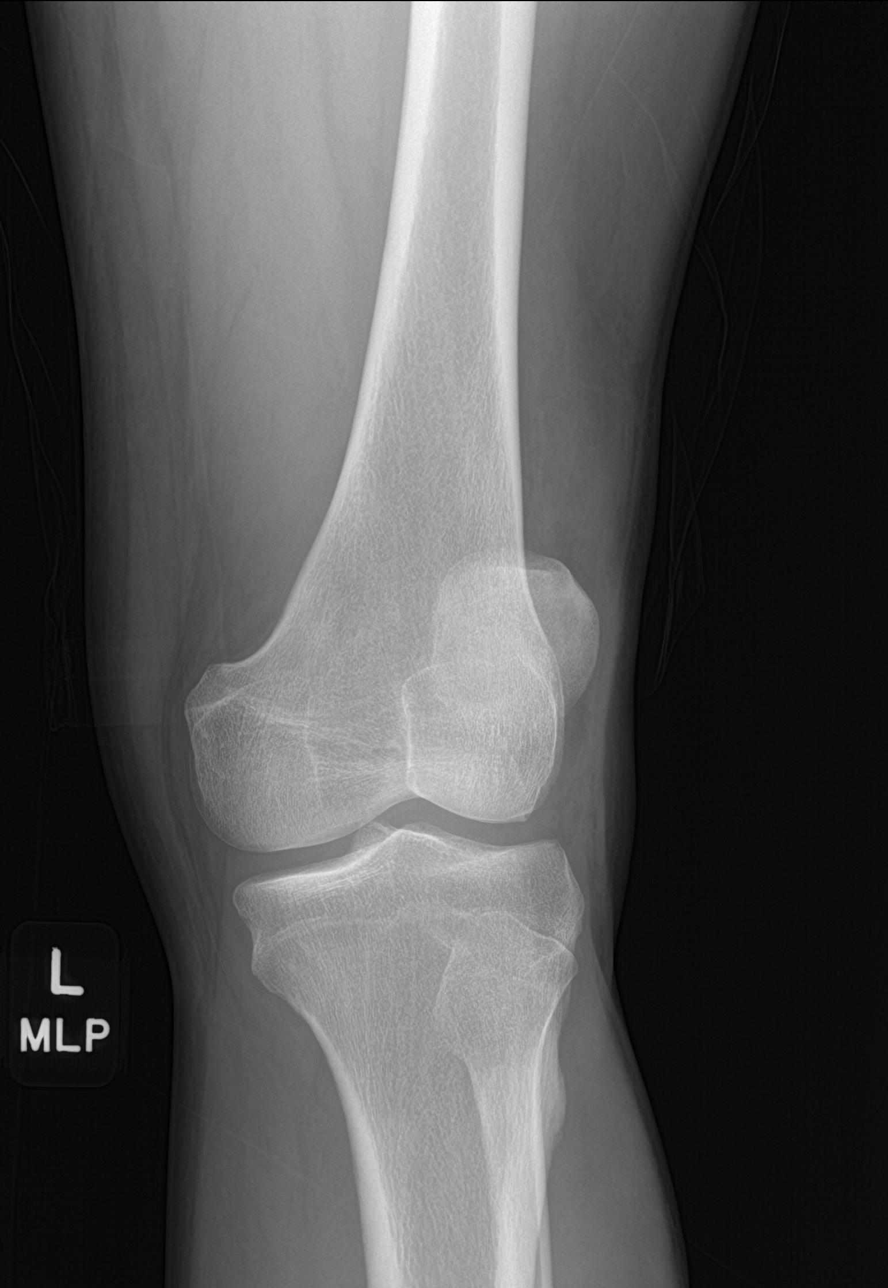

[4 of 4 positions shown; findings below may reference images not displayed]

FINDINGS: No evidence of fracture, dislocation, or joint effusion. No evidence
of arthropathy or other focal bone abnormality. Soft tissues are
unremarkable.
IMPRESSION: Negative.
